# Patient Record
Sex: Male | Born: 1985 | Race: White | Hispanic: No | Marital: Married | Smoking: Current every day smoker
Health system: Southern US, Community
[De-identification: ages and names within clinical notes are randomized; demographics above are authoritative.]

## PROBLEM LIST (undated history)

## (undated) DIAGNOSIS — IMO0002 Reserved for concepts with insufficient information to code with codable children: Secondary | ICD-10-CM

## (undated) DIAGNOSIS — M51369 Other intervertebral disc degeneration, lumbar region without mention of lumbar back pain or lower extremity pain: Secondary | ICD-10-CM

## (undated) DIAGNOSIS — M5136 Other intervertebral disc degeneration, lumbar region: Secondary | ICD-10-CM

## (undated) HISTORY — PX: OTHER SURGICAL HISTORY: SHX169

## (undated) HISTORY — PX: APPENDECTOMY: SHX54

---

## 2009-12-30 ENCOUNTER — Emergency Department (HOSPITAL_COMMUNITY): Admission: EM | Admit: 2009-12-30 | Discharge: 2009-12-31 | Payer: Self-pay | Admitting: Emergency Medicine

## 2010-01-05 ENCOUNTER — Emergency Department (HOSPITAL_COMMUNITY): Admission: EM | Admit: 2010-01-05 | Discharge: 2010-01-05 | Payer: Self-pay | Admitting: Emergency Medicine

## 2010-01-08 ENCOUNTER — Emergency Department (HOSPITAL_COMMUNITY): Admission: EM | Admit: 2010-01-08 | Discharge: 2010-01-08 | Payer: Self-pay | Admitting: Emergency Medicine

## 2010-03-23 ENCOUNTER — Emergency Department (HOSPITAL_COMMUNITY): Admission: EM | Admit: 2010-03-23 | Discharge: 2010-03-23 | Payer: Self-pay | Admitting: Emergency Medicine

## 2010-03-27 ENCOUNTER — Emergency Department (HOSPITAL_COMMUNITY): Admission: EM | Admit: 2010-03-27 | Discharge: 2010-03-27 | Payer: Self-pay | Admitting: Emergency Medicine

## 2010-04-13 ENCOUNTER — Emergency Department (HOSPITAL_COMMUNITY): Admission: EM | Admit: 2010-04-13 | Discharge: 2010-04-13 | Payer: Self-pay | Admitting: Emergency Medicine

## 2010-04-16 ENCOUNTER — Emergency Department (HOSPITAL_COMMUNITY): Admission: EM | Admit: 2010-04-16 | Discharge: 2010-04-16 | Payer: Self-pay | Admitting: Emergency Medicine

## 2010-05-12 ENCOUNTER — Emergency Department (HOSPITAL_COMMUNITY)
Admission: EM | Admit: 2010-05-12 | Discharge: 2010-05-12 | Payer: Self-pay | Source: Home / Self Care | Admitting: Emergency Medicine

## 2010-05-29 ENCOUNTER — Emergency Department (HOSPITAL_COMMUNITY)
Admission: EM | Admit: 2010-05-29 | Discharge: 2010-05-29 | Payer: Self-pay | Source: Home / Self Care | Admitting: Emergency Medicine

## 2010-08-08 ENCOUNTER — Emergency Department (HOSPITAL_COMMUNITY)
Admission: EM | Admit: 2010-08-08 | Discharge: 2010-08-08 | Disposition: A | Discharge status: Home / Self Care | Payer: Self-pay | Attending: Emergency Medicine | Admitting: Emergency Medicine

## 2010-08-08 DIAGNOSIS — S335XXA Sprain of ligaments of lumbar spine, initial encounter: Secondary | ICD-10-CM | POA: Insufficient documentation

## 2010-08-08 DIAGNOSIS — M546 Pain in thoracic spine: Secondary | ICD-10-CM | POA: Insufficient documentation

## 2010-08-08 DIAGNOSIS — G8929 Other chronic pain: Secondary | ICD-10-CM | POA: Insufficient documentation

## 2010-08-08 DIAGNOSIS — M545 Low back pain, unspecified: Secondary | ICD-10-CM | POA: Insufficient documentation

## 2010-08-08 DIAGNOSIS — IMO0002 Reserved for concepts with insufficient information to code with codable children: Secondary | ICD-10-CM | POA: Insufficient documentation

## 2010-08-08 DIAGNOSIS — W108XXA Fall (on) (from) other stairs and steps, initial encounter: Secondary | ICD-10-CM | POA: Insufficient documentation

## 2010-08-14 ENCOUNTER — Emergency Department (HOSPITAL_COMMUNITY): Payer: Self-pay

## 2010-08-14 ENCOUNTER — Emergency Department (HOSPITAL_COMMUNITY)
Admission: EM | Admit: 2010-08-14 | Discharge: 2010-08-14 | Disposition: A | Discharge status: Home / Self Care | Payer: Self-pay | Attending: Emergency Medicine | Admitting: Emergency Medicine

## 2010-08-14 DIAGNOSIS — W19XXXA Unspecified fall, initial encounter: Secondary | ICD-10-CM | POA: Insufficient documentation

## 2010-08-14 DIAGNOSIS — M549 Dorsalgia, unspecified: Secondary | ICD-10-CM | POA: Insufficient documentation

## 2010-08-14 DIAGNOSIS — F17200 Nicotine dependence, unspecified, uncomplicated: Secondary | ICD-10-CM | POA: Insufficient documentation

## 2010-08-19 ENCOUNTER — Inpatient Hospital Stay (INDEPENDENT_AMBULATORY_CARE_PROVIDER_SITE_OTHER)
Admission: RE | Admit: 2010-08-19 | Discharge: 2010-08-19 | Disposition: A | Discharge status: Home / Self Care | Payer: Self-pay | Source: Ambulatory Visit | Attending: Family Medicine | Admitting: Family Medicine

## 2010-08-19 DIAGNOSIS — S335XXA Sprain of ligaments of lumbar spine, initial encounter: Secondary | ICD-10-CM

## 2010-08-27 ENCOUNTER — Emergency Department (HOSPITAL_COMMUNITY)
Admission: EM | Admit: 2010-08-27 | Discharge: 2010-08-27 | Disposition: A | Discharge status: Home / Self Care | Payer: Self-pay | Attending: Emergency Medicine | Admitting: Emergency Medicine

## 2010-08-27 ENCOUNTER — Emergency Department (HOSPITAL_COMMUNITY): Payer: Self-pay

## 2010-08-27 DIAGNOSIS — M545 Low back pain, unspecified: Secondary | ICD-10-CM | POA: Insufficient documentation

## 2010-08-27 DIAGNOSIS — W108XXA Fall (on) (from) other stairs and steps, initial encounter: Secondary | ICD-10-CM | POA: Insufficient documentation

## 2010-08-27 DIAGNOSIS — IMO0002 Reserved for concepts with insufficient information to code with codable children: Secondary | ICD-10-CM | POA: Insufficient documentation

## 2010-08-27 DIAGNOSIS — G8929 Other chronic pain: Secondary | ICD-10-CM | POA: Insufficient documentation

## 2010-11-07 ENCOUNTER — Emergency Department: Payer: Self-pay | Admitting: Emergency Medicine

## 2010-12-16 ENCOUNTER — Emergency Department: Payer: Self-pay | Admitting: Emergency Medicine

## 2013-05-18 ENCOUNTER — Encounter (HOSPITAL_COMMUNITY): Payer: Self-pay | Admitting: Emergency Medicine

## 2013-05-18 ENCOUNTER — Emergency Department (HOSPITAL_COMMUNITY)
Admission: EM | Admit: 2013-05-18 | Discharge: 2013-05-18 | Disposition: A | Discharge status: Home / Self Care | Payer: Self-pay | Attending: Emergency Medicine | Admitting: Emergency Medicine

## 2013-05-18 ENCOUNTER — Emergency Department (HOSPITAL_COMMUNITY): Payer: Self-pay

## 2013-05-18 DIAGNOSIS — M549 Dorsalgia, unspecified: Secondary | ICD-10-CM | POA: Insufficient documentation

## 2013-05-18 DIAGNOSIS — IMO0002 Reserved for concepts with insufficient information to code with codable children: Secondary | ICD-10-CM | POA: Insufficient documentation

## 2013-05-18 DIAGNOSIS — Y9389 Activity, other specified: Secondary | ICD-10-CM | POA: Insufficient documentation

## 2013-05-18 DIAGNOSIS — X500XXA Overexertion from strenuous movement or load, initial encounter: Secondary | ICD-10-CM | POA: Insufficient documentation

## 2013-05-18 DIAGNOSIS — Y929 Unspecified place or not applicable: Secondary | ICD-10-CM | POA: Insufficient documentation

## 2013-05-18 HISTORY — DX: Reserved for concepts with insufficient information to code with codable children: IMO0002

## 2013-05-18 MED ORDER — ORPHENADRINE CITRATE ER 100 MG PO TB12: 100.0000 mg | ORAL_TABLET | Freq: Two times a day (BID) | ORAL | Status: DC | PRN

## 2013-05-18 MED ORDER — SODIUM CHLORIDE 0.9 % IV BOLUS (SEPSIS)
500.0000 mL | Freq: Once | INTRAVENOUS | Status: AC
  Administered 2013-05-18: 500 mL via INTRAVENOUS

## 2013-05-18 MED ORDER — DIAZEPAM 5 MG/ML IJ SOLN
5.0000 mg | Freq: Once | INTRAMUSCULAR | Status: AC
  Administered 2013-05-18: 5 mg via INTRAVENOUS
  Filled 2013-05-18: qty 2

## 2013-05-18 MED ORDER — OXYCODONE-ACETAMINOPHEN 5-325 MG PO TABS: 1.0000 | ORAL_TABLET | ORAL | Status: DC | PRN

## 2013-05-18 MED ORDER — KETOROLAC TROMETHAMINE 30 MG/ML IJ SOLN
30.0000 mg | Freq: Once | INTRAMUSCULAR | Status: AC
  Administered 2013-05-18: 30 mg via INTRAVENOUS
  Filled 2013-05-18: qty 1

## 2013-05-18 MED ORDER — KETOROLAC TROMETHAMINE 60 MG/2ML IM SOLN
60.0000 mg | Freq: Once | INTRAMUSCULAR | Status: DC
  Filled 2013-05-18: qty 2

## 2013-05-18 NOTE — ED Notes (Signed)
Pt presents via [facility] with c/o back back. Pt was lifting a palette into his truck today when he felt a "pop" in his mid/low back. Pt reports immediately after the pop he had extreme pain shoot from his back throughout his body. Pt in LSB and C-collar on arrival. 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

## 2013-05-18 NOTE — ED Notes (Signed)
Pt ambulatory from tx room without difficulty. Pt comfortable with d/c and f/u instructions. Prescriptions x2. In NAD.

## 2013-05-18 NOTE — ED Provider Notes (Signed)
CSN: [id number]     Arrival date & time 05/18/13  1648 History   First MD Initiated Contact with Patient 05/18/13 1654     Chief Complaint  Patient presents with  . Back Pain   (Consider location/radiation/quality/duration/timing/severity/associated sxs/prior Treatment) HPI Comments: 27 year old male past medical history significant for previous disc problem in L. spine comes in with back pain. Patient states he was lifting a pallet out of the back of his truck approximately 30 mins prior arrival. When bending over and lifting he felt a pop in his mid thoracic or upper lumbar area. He had immediate sharp stabbing pain which is 10 out of 10. It radiated throughout his entire spine. Any numbness or tingling. Had no loss of motor or sensation. Pain now slightly improved however still 8/10. Worse with palpation movement.  Patient is a 27 y.o. male presenting with back pain.  Back Pain Location:  Thoracic spine Quality:  Stabbing Radiates to: initially radaiting throughout spine now no radiation. Pain severity:  Severe Pain is:  Unable to specify Onset quality:  Sudden Duration:  1 hour Timing:  Constant Progression:  Partially resolved Chronicity:  Recurrent Context: lifting heavy objects   Context comment:  Lifting Relieved by:  Nothing Worsened by:  Bending and movement Ineffective treatments:  None tried Associated symptoms: no abdominal pain, no chest pain, no dysuria, no headaches, no numbness and no weakness     Past Medical History  Diagnosis Date  . Bulging disc    History reviewed. No pertinent past surgical history. No family history on file. History  Substance Use Topics  . Smoking status: Not on file  . Smokeless tobacco: Not on file  . Alcohol Use: Not on file    Review of Systems  Constitutional: Negative for fatigue.  Respiratory: Negative for cough.   Cardiovascular: Negative for chest pain.  Gastrointestinal: Negative for abdominal pain.  Genitourinary:  Negative for dysuria.  Musculoskeletal: Positive for back pain.  Neurological: Negative for dizziness, weakness, numbness and headaches.  All other systems reviewed and are negative.    Allergies  Review of patient's allergies indicates no known allergies.  Home Medications   Current Outpatient Rx  Name  Route  Sig  Dispense  Refill  . orphenadrine (NORFLEX) 100 MG tablet   Oral   Take 1 tablet (100 mg total) by mouth 2 (two) times daily as needed for muscle spasms.   20 tablet   0   . oxyCODONE-acetaminophen (PERCOCET/ROXICET) 5-325 MG per tablet   Oral   Take 1-2 tablets by mouth every 4 (four) hours as needed for severe pain.   20 tablet   0    BP 115/73  Pulse 67  Temp(Src) 98.3 F (36.8 C) (Oral)  Resp 16  SpO2 99% Physical Exam  Nursing note and vitals reviewed. Constitutional: He is oriented to person, place, and time. He appears well-developed and well-nourished.  HENT:  Head: Normocephalic and atraumatic.  Eyes: EOM are normal. Pupils are equal, round, and reactive to light.  Neck:  Placed in collar by EMS  Cardiovascular: Normal rate, regular rhythm and intact distal pulses.   No murmur heard. Pulmonary/Chest: Effort normal and breath sounds normal. No respiratory distress. He exhibits no tenderness.  Abdominal: Soft. He exhibits no distension. There is no tenderness. There is no rebound and no guarding.  Musculoskeletal: Normal range of motion. He exhibits tenderness (spine).  Patient with: Tenderness to palpation over the lower thoracic and upper lumbar spine. Pain is located  directly over the spine minimal tenderness palpation of the paraspinous region.    Neurological: He is alert and oriented to person, place, and time. No cranial nerve deficit. He exhibits normal muscle tone. Coordination normal.  No focal neuro deficits. Patient denies any numbness, tingling throughout. Motor intact throughout. Sensation intact to fine, pinprick and temperature  throughout. Denies saddle anesthesia urinary incontinence.  Skin: Skin is warm and dry. No rash noted.  Psychiatric: He has a normal mood and affect. His behavior is normal. Judgment and thought content normal.    ED Course  Procedures (including critical care time) Labs Review Labs Reviewed - No data to display Imaging Review Dg Thoracic Spine 2 View  05/18/2013   CLINICAL DATA:  The patient was lifting a Pallet and felt his back popped. Pain in the mid and lower back. History of lower thoracic/ upper lumbar spine injury.  EXAM: THORACIC SPINE - 2 VIEW  COMPARISON:  None.  FINDINGS: There is no evidence of thoracic spine fracture. Alignment is normal. No other significant bone abnormalities are identified.  IMPRESSION: Negative.   Electronically Signed   By: Rosalie Gums M.D.   On: 05/18/2013 18:42   Dg Lumbar Spine Complete  05/18/2013   CLINICAL DATA:  Pain Raziyah Vanvleck trauma  EXAM: LUMBAR SPINE - COMPLETE 4+ VIEW  COMPARISON:  August 27, 2010  FINDINGS: Frontal, lateral, spot lumbosacral lateral, and bilateral oblique views were obtained. There are 5 non-rib-bearing lumbar type vertebral bodies. There is no fracture or spondylolisthesis. Disk spaces appear intact. There is no appreciable facet arthropathy.  IMPRESSION: No fracture or appreciable arthropathic change.   Electronically Signed   By: Bretta Bang M.D.   On: 05/18/2013 18:30    EKG Interpretation   None       MDM   1. Back pain     27 year old male back pain after lifting heavy pallet. On exam patient is neurovascularly intact. Patient denies any numbness or tingling. Denies any bladder or bowel dysfunction. No saddle anesthesia. On exam there is no step-offs deformities or other concerning findings. X-rays are obtained within normal limits. Reexam patient continues to have normal neurological exam without any deficits. The pain has improved with muscle relaxant and pain medicine. Patient able to ambulate tolerate by mouth.  Will provided work note to minimize lifting for one week. Will be given muscle relaxers and small narcotic prescription. At this time doubt any serious spinal pathology. Patient was given return precautions for concerning issues such as numbness, tingling, motor dysfunction etc. Voiced understanding and patient was discharged without further issues.   Bridgett Larsson, MD 05/18/13 [phone]

## 2013-05-19 NOTE — ED Provider Notes (Signed)
I saw and evaluated the patient, reviewed the resident's note and I agree with the findings and plan.  EKG Interpretation   None       Patient with back pain after lifting. Most tender on paraspinal muscles. Likely spasm/strain. Will treat symptomatically. No neurologic dysfunction or B/B incontinence. Normal gait.  Audree Camel, MD 05/19/13 1314

## 2013-10-02 ENCOUNTER — Emergency Department (HOSPITAL_COMMUNITY)
Admission: EM | Admit: 2013-10-02 | Discharge: 2013-10-02 | Disposition: A | Discharge status: Home / Self Care | Payer: No Typology Code available for payment source | Attending: Emergency Medicine | Admitting: Emergency Medicine

## 2013-10-02 ENCOUNTER — Encounter (HOSPITAL_COMMUNITY): Payer: Self-pay | Admitting: Emergency Medicine

## 2013-10-02 DIAGNOSIS — Z79899 Other long term (current) drug therapy: Secondary | ICD-10-CM | POA: Insufficient documentation

## 2013-10-02 DIAGNOSIS — IMO0002 Reserved for concepts with insufficient information to code with codable children: Secondary | ICD-10-CM | POA: Insufficient documentation

## 2013-10-02 DIAGNOSIS — K529 Noninfective gastroenteritis and colitis, unspecified: Secondary | ICD-10-CM

## 2013-10-02 DIAGNOSIS — B349 Viral infection, unspecified: Secondary | ICD-10-CM

## 2013-10-02 DIAGNOSIS — A088 Other specified intestinal infections: Secondary | ICD-10-CM | POA: Insufficient documentation

## 2013-10-02 LAB — URINALYSIS, ROUTINE W REFLEX MICROSCOPIC
Bilirubin Urine: NEGATIVE
Glucose, UA: NEGATIVE mg/dL
KETONES UR: NEGATIVE mg/dL
Leukocytes, UA: NEGATIVE
NITRITE: NEGATIVE
PH: 6.5 (ref 5.0–8.0)
Protein, ur: 30 mg/dL — AB
SPECIFIC GRAVITY, URINE: 1.021 (ref 1.005–1.030)
Urobilinogen, UA: 0.2 mg/dL (ref 0.0–1.0)

## 2013-10-02 LAB — CBC WITH DIFFERENTIAL/PLATELET
BASOS PCT: 0 % (ref 0–1)
Basophils Absolute: 0 10*3/uL (ref 0.0–0.1)
EOS ABS: 0.2 10*3/uL (ref 0.0–0.7)
EOS PCT: 2 % (ref 0–5)
HCT: 45.4 % (ref 39.0–52.0)
HEMOGLOBIN: 15 g/dL (ref 13.0–17.0)
LYMPHS ABS: 2 10*3/uL (ref 0.7–4.0)
Lymphocytes Relative: 14 % (ref 12–46)
MCH: 30 pg (ref 26.0–34.0)
MCHC: 33 g/dL (ref 30.0–36.0)
MCV: 90.8 fL (ref 78.0–100.0)
MONOS PCT: 4 % (ref 3–12)
Monocytes Absolute: 0.5 10*3/uL (ref 0.1–1.0)
Neutro Abs: 11.2 10*3/uL — ABNORMAL HIGH (ref 1.7–7.7)
Neutrophils Relative %: 80 % — ABNORMAL HIGH (ref 43–77)
PLATELETS: 288 10*3/uL (ref 150–400)
RBC: 5 MIL/uL (ref 4.22–5.81)
RDW: 13.6 % (ref 11.5–15.5)
WBC: 14 10*3/uL — ABNORMAL HIGH (ref 4.0–10.5)

## 2013-10-02 LAB — COMPREHENSIVE METABOLIC PANEL
ALBUMIN: 4.2 g/dL (ref 3.5–5.2)
ALT: 25 U/L (ref 0–53)
AST: 30 U/L (ref 0–37)
Alkaline Phosphatase: 115 U/L (ref 39–117)
BUN: 12 mg/dL (ref 6–23)
CALCIUM: 9.7 mg/dL (ref 8.4–10.5)
CO2: 24 mEq/L (ref 19–32)
CREATININE: 0.73 mg/dL (ref 0.50–1.35)
Chloride: 104 mEq/L (ref 96–112)
GFR calc non Af Amer: >90 mL/min (ref >90)
GLUCOSE: 116 mg/dL — AB (ref 70–99)
Potassium: 4.3 mEq/L (ref 3.7–5.3)
Sodium: 141 mEq/L (ref 137–147)
TOTAL PROTEIN: 7.6 g/dL (ref 6.0–8.3)
Total Bilirubin: 0.3 mg/dL (ref 0.3–1.2)

## 2013-10-02 LAB — URINE MICROSCOPIC-ADD ON

## 2013-10-02 LAB — LIPASE, BLOOD: Lipase: 30 U/L (ref 11–59)

## 2013-10-02 MED ORDER — ONDANSETRON HCL 4 MG PO TABS: 4.0000 mg | ORAL_TABLET | Freq: Four times a day (QID) | ORAL | Status: DC

## 2013-10-02 MED ORDER — KETOROLAC TROMETHAMINE 30 MG/ML IJ SOLN: 30.0000 mg | Freq: Once | INTRAMUSCULAR | Status: DC

## 2013-10-02 MED ORDER — SODIUM CHLORIDE 0.9 % IV BOLUS (SEPSIS)
1000.0000 mL | Freq: Once | INTRAVENOUS | Status: AC
  Administered 2013-10-02: 1000 mL via INTRAVENOUS

## 2013-10-02 MED ORDER — KETOROLAC TROMETHAMINE 15 MG/ML IJ SOLN
15.0000 mg | Freq: Once | INTRAMUSCULAR | Status: AC
  Administered 2013-10-02: 15 mg via INTRAVENOUS
  Filled 2013-10-02: qty 1

## 2013-10-02 MED ORDER — ONDANSETRON HCL 4 MG/2ML IJ SOLN
4.0000 mg | Freq: Once | INTRAMUSCULAR | Status: AC
  Administered 2013-10-02: 4 mg via INTRAVENOUS
  Filled 2013-10-02: qty 2

## 2013-10-02 MED ORDER — KETOROLAC TROMETHAMINE 30 MG/ML IJ SOLN: 15.0000 mg | Freq: Once | INTRAMUSCULAR | Status: DC

## 2013-10-02 NOTE — ED Notes (Signed)
PT tolerating PO fluids without issue

## 2013-10-02 NOTE — ED Provider Notes (Signed)
CSN: [id number]     Arrival date & time 10/02/13  1147 History   First MD Initiated Contact with Patient 10/02/13 1157     Chief Complaint  Patient presents with  . Emesis     (Consider location/radiation/quality/duration/timing/severity/associated sxs/prior Treatment) The history is provided by the patient. No language interpreter was used.  Dave Wolf is a  28 year old male with past medical history bulging discs presenting to the ED with emesis and nausea that started this morning at approximately 6:00 AM. Patient reported that he has been having ongoing nausea here he stated he had at least 3 episodes of emesis-mainly of food-NB/NB. Reported that he's been having generalized bodyaches more so in his legs. Stated he's been experiencing a gurgling sensation in his abdomen and a soreness after emesis. Patient reported he felt just fine yesterday-reportedly went pain pulling followed by [employer] for dinner. Stated that he's been trying to keep down water, reported that he is unable to keep any food or fluids down. Denied neck pain, neck stiffness, diarrhea, dizziness, back pain, chest pain, shortness of breath, difficulty breathing, urinary symptoms, melena, medications your, constipation, abdominal pain, sudden loss of vision, syncope, visual distortions, headaches. PCP none  Past Medical History  Diagnosis Date  . Bulging disc    History reviewed. No pertinent past surgical history. History reviewed. No pertinent family history. History  Substance Use Topics  . Smoking status: Not on file  . Smokeless tobacco: Not on file  . Alcohol Use: Not on file    Review of Systems  Constitutional: Negative for fever, chills and diaphoresis.  Eyes: Negative for visual disturbance.  Respiratory: Negative for chest tightness and shortness of breath.   Cardiovascular: Negative for chest pain.  Gastrointestinal: Positive for nausea and vomiting. Negative for abdominal pain, diarrhea, constipation,  blood in stool and anal bleeding.  Musculoskeletal: Negative for back pain, neck pain and neck stiffness.  Neurological: Negative for dizziness, weakness and headaches.  All other systems reviewed and are negative.     Allergies  Review of patient's allergies indicates no known allergies.  Home Medications   Prior to Admission medications   Medication Sig Start Date End Date Taking? Authorizing Provider  orphenadrine (NORFLEX) 100 MG tablet Take 1 tablet (100 mg total) by mouth 2 (two) times daily as needed for muscle spasms. 05/18/13   Bridgett Larsson, MD  oxyCODONE-acetaminophen (PERCOCET/ROXICET) 5-325 MG per tablet Take 1-2 tablets by mouth every 4 (four) hours as needed for severe pain. 05/18/13   Bridgett Larsson, MD   BP 139/86  Pulse 82  Temp(Src) 98.4 F (36.9 C) (Oral)  Resp 16  Ht 6\' 3"  (1.905 m)  Wt 192 lb 12.8 oz (87.454 kg)  BMI 24.10 kg/m2  SpO2 100% Physical Exam  Nursing note and vitals reviewed. Constitutional: He is oriented to person, place, and time. He appears well-developed and well-nourished. No distress.  HENT:  Head: Normocephalic and atraumatic.  Mouth/Throat: No oropharyngeal exudate.  Mild dry mucous membranes  Eyes: Conjunctivae and EOM are normal. Pupils are equal, round, and reactive to light. Right eye exhibits no discharge. Left eye exhibits no discharge.  Neck: Normal range of motion. Neck supple. No tracheal deviation present.  Negative neck stiffness Negative nuchal rigidity Negative cervical lymphadenopathy Negative meningeal signs  Cardiovascular: Normal rate, regular rhythm and normal heart sounds.  Exam reveals no friction rub.   No murmur heard. Pulses:      Radial pulses are 2+ on the right side, and 2+ on  the left side.       Dorsalis pedis pulses are 2+ on the right side, and 2+ on the left side.  Pulmonary/Chest: Effort normal and breath sounds normal. No respiratory distress. He has no wheezes. He has no rales.  Abdominal: Soft. Bowel  sounds are normal. There is no tenderness. There is no rebound and no guarding.  Negative abdominal distention Soft upon palpation Negative peritoneal signs Negative rigidity upon palpation  Musculoskeletal: Normal range of motion.  Full ROM to upper and lower extremities without difficulty noted, negative ataxia noted.  Lymphadenopathy:    He has no cervical adenopathy.  Neurological: He is alert and oriented to person, place, and time. No cranial nerve deficit. He exhibits normal muscle tone. Coordination normal.  Skin: Skin is warm and dry. No rash noted. He is not diaphoretic. No erythema.  Psychiatric: He has a normal mood and affect. His behavior is normal. Thought content normal.    ED Course  Procedures (including critical care time)  Results for orders placed during the hospital encounter of 10/02/13  CBC WITH DIFFERENTIAL      Result Value Ref Range   WBC 14.0 (*) 4.0 - 10.5 K/uL   RBC 5.00  4.22 - 5.81 MIL/uL   Hemoglobin 15.0  13.0 - 17.0 g/dL   HCT 16.145.4  09.639.0 - 04.552.0 %   MCV 90.8  78.0 - 100.0 fL   MCH 30.0  26.0 - 34.0 pg   MCHC 33.0  30.0 - 36.0 g/dL   RDW 40.913.6  81.111.5 - 91.415.5 %   Platelets 288  150 - 400 K/uL   Neutrophils Relative % 80 (*) 43 - 77 %   Neutro Abs 11.2 (*) 1.7 - 7.7 K/uL   Lymphocytes Relative 14  12 - 46 %   Lymphs Abs 2.0  0.7 - 4.0 K/uL   Monocytes Relative 4  3 - 12 %   Monocytes Absolute 0.5  0.1 - 1.0 K/uL   Eosinophils Relative 2  0 - 5 %   Eosinophils Absolute 0.2  0.0 - 0.7 K/uL   Basophils Relative 0  0 - 1 %   Basophils Absolute 0.0  0.0 - 0.1 K/uL  COMPREHENSIVE METABOLIC PANEL      Result Value Ref Range   Sodium 141  137 - 147 mEq/L   Potassium 4.3  3.7 - 5.3 mEq/L   Chloride 104  96 - 112 mEq/L   CO2 24  19 - 32 mEq/L   Glucose, Bld 116 (*) 70 - 99 mg/dL   BUN 12  6 - 23 mg/dL   Creatinine, Ser 7.820.73  0.50 - 1.35 mg/dL   Calcium 9.7  8.4 - 95.610.5 mg/dL   Total Protein 7.6  6.0 - 8.3 g/dL   Albumin 4.2  3.5 - 5.2 g/dL   AST 30   0 - 37 U/L   ALT 25  0 - 53 U/L   Alkaline Phosphatase 115  39 - 117 U/L   Total Bilirubin 0.3  0.3 - 1.2 mg/dL   GFR calc non Af Amer >90  >90 mL/min   GFR calc Af Amer >90  >90 mL/min  URINALYSIS, ROUTINE W REFLEX MICROSCOPIC      Result Value Ref Range   Color, Urine YELLOW  YELLOW   APPearance CLEAR  CLEAR   Specific Gravity, Urine 1.021  1.005 - 1.030   pH 6.5  5.0 - 8.0   Glucose, UA NEGATIVE  NEGATIVE mg/dL  Hgb urine dipstick MODERATE (*) NEGATIVE   Bilirubin Urine NEGATIVE  NEGATIVE   Ketones, ur NEGATIVE  NEGATIVE mg/dL   Protein, ur 30 (*) NEGATIVE mg/dL   Urobilinogen, UA 0.2  0.0 - 1.0 mg/dL   Nitrite NEGATIVE  NEGATIVE   Leukocytes, UA NEGATIVE  NEGATIVE  LIPASE, BLOOD      Result Value Ref Range   Lipase 30  11 - 59 U/L  URINE MICROSCOPIC-ADD ON      Result Value Ref Range   RBC / HPF 3-6  <3 RBC/hpf    Labs Review Labs Reviewed  CBC WITH DIFFERENTIAL - Abnormal; Notable for the following:    WBC 14.0 (*)    Neutrophils Relative % 80 (*)    Neutro Abs 11.2 (*)    All other components within normal limits  COMPREHENSIVE METABOLIC PANEL - Abnormal; Notable for the following:    Glucose, Bld 116 (*)    All other components within normal limits  URINALYSIS, ROUTINE W REFLEX MICROSCOPIC - Abnormal; Notable for the following:    Hgb urine dipstick MODERATE (*)    Protein, ur 30 (*)    All other components within normal limits  LIPASE, BLOOD  URINE MICROSCOPIC-ADD ON    Imaging Review No results found.   EKG Interpretation None      MDM   Final diagnoses:  Gastroenteritis  Viral syndrome   Medications  sodium chloride 0.9 % bolus 1,000 mL (0 mLs Intravenous Stopped 10/02/13 1426)  ondansetron (ZOFRAN) injection 4 mg (4 mg Intravenous Given 10/02/13 1318)  ketorolac (TORADOL) 15 MG/ML injection 15 mg (15 mg Intravenous Given 10/02/13 1344)  sodium chloride 0.9 % bolus 1,000 mL (1,000 mLs Intravenous New Bag/Given 10/02/13 1431)   Filed Vitals:    10/02/13 1530 10/02/13 1545 10/02/13 1625 10/02/13 1659  BP: 129/76 122/84 134/87 139/86  Pulse: 74 85 85 82  Temp:  98.4 F (36.9 C)    TempSrc:  Oral    Resp:  16 16 16   Height:      Weight:      SpO2: 99% 100% 99% 100%    CBC noted elevation Traub blood cell count of 14.0 with left shift of neutrophils at 11.2. CMP negative findings-kidney and liver functioning well. Lipase negative elevation. Urinalysis negative for nitrates, leukocytes. Moderate hemoglobin identified in the urine. Patient appears dry-will hydrate with IV fluids. 5:12 PM This provider reassessed the patient's abdomen. Bowel sounds normoactive in all 4 quadrants. Soft upon palpation. Negative peritoneal signs. Negative right upper quadrant tenderness. Negative tenderness upon palpation to the abdomen. Nonsurgical abdomen again noted. Doubt appendicitis-patient had appendectomy performed years ago. Doubt pancreatitis. Doubt cholecystitis/cholangitis. Doubt acute abdominal processes. Abdomen soft, nontender. Negative peritoneal signs-nonsurgical abdomen noted on exam. Suspicion to be possible viral infection, cannot rule out food poisoning. Patient hydrated in ED setting. Patient tolerated fluids by mouth without difficulty-negative episodes of emesis while in ED setting. Leukocytosis with unknown etiology-suspicion to be possible viral infection. Patient stable, afebrile. Patient is not septic appearing. Negative hypoxia. Negative signs of respiratory distress. Discharged patient. Discharge patient with Zofran. Discussed with patient to rest and stay hydrated to drink plenty of fluids. Discussed with patient proper diet. Referred patient to urgent care Center and health [facility] wellness Center to be reassessed next week for repeat labs and urine. Referred patient to urology regarding Hgb in urine. Discussed with patient to closely monitor symptoms and if symptoms are to worsen or change to report back to the ED -  strict return  instructions given.  Patient agreed to plan of care, understood, all questions answered.   Raymon MuttonMarissa Jeanene Mena, PA-C 10/03/13 1155

## 2013-10-02 NOTE — Discharge Instructions (Signed)
Please call and set-up an appointment with [facility] to be re-assessed within the next couple of days for bloodwork to be repeated Please call and set up an appointment with urology regarding blood in the urine Please rest and stay hydrated Please take nausea medications as prescribed and as needed Please avoid foods high in fat and grease - stick with a lite diet for the next couple of days Please continue to monitor symptoms closely and if symptoms are to worsen or change (fever greater than 101, chills, chest pain, shortness of breath, difficulty breathing, numbness, tingling, abdominal pain, blood in stools, black for stools, blood in the urine, difficulty urinating, urine decrease, inability to keep food or fluids down) please report back to the ED immediately  Viral Gastroenteritis Viral gastroenteritis is also known as stomach flu. This condition affects the stomach and intestinal tract. It can cause sudden diarrhea and vomiting. The illness typically lasts 3 to 8 days. Most people develop an immune response that eventually gets rid of the virus. While this natural response develops, the virus can make you quite ill. CAUSES  Many different viruses can cause gastroenteritis, such as rotavirus or noroviruses. You can catch one of these viruses by consuming contaminated food or water. You may also catch a virus by sharing utensils or other personal items with an infected person or by touching a contaminated surface. SYMPTOMS  The most common symptoms are diarrhea and vomiting. These problems can cause a severe loss of body fluids (dehydration) and a body salt (electrolyte) imbalance. Other symptoms may include:  Fever.  Headache.  Fatigue.  Abdominal pain. DIAGNOSIS  Your caregiver can usually diagnose viral gastroenteritis based on your symptoms and a physical exam. A stool sample may also be taken to test for the presence of viruses or other  infections. TREATMENT  This illness typically goes away on its own. Treatments are aimed at rehydration. The most serious cases of viral gastroenteritis involve vomiting so severely that you are not able to keep fluids down. In these cases, fluids must be given through an intravenous line (IV). HOME CARE INSTRUCTIONS   Drink enough fluids to keep your urine clear or pale yellow. Drink small amounts of fluids frequently and increase the amounts as tolerated.  Ask your caregiver for specific rehydration instructions.  Avoid:  Foods high in sugar.  Alcohol.  Carbonated drinks.  Tobacco.  Juice.  Caffeine drinks.  Extremely hot or cold fluids.  Fatty, greasy foods.  Too much intake of anything at one time.  Dairy products until 24 to 48 hours after diarrhea stops.  You may consume probiotics. Probiotics are active cultures of beneficial bacteria. They may lessen the amount and number of diarrheal stools in adults. Probiotics can be found in yogurt with active cultures and in supplements.  Wash your hands well to avoid spreading the virus.  Only take over-the-counter or prescription medicines for pain, discomfort, or fever as directed by your caregiver. Do not give aspirin to children. Antidiarrheal medicines are not recommended.  Ask your caregiver if you should continue to take your regular prescribed and over-the-counter medicines.  Keep all follow-up appointments as directed by your caregiver. SEEK IMMEDIATE MEDICAL CARE IF:   You are unable to keep fluids down.  You do not urinate at least once every 6 to 8 hours.  You develop shortness of breath.  You notice blood in your stool or vomit. This may look like coffee grounds.  You  have abdominal pain that increases or is concentrated in one small area (localized).  You have persistent vomiting or diarrhea.  You have a fever.  The patient is a child younger than 3 months, and he or she has a fever.  The patient  is a child older than 3 months, and he or she has a fever and persistent symptoms.  The patient is a child older than 3 months, and he or she has a fever and symptoms suddenly get worse.  The patient is a baby, and he or she has no tears when crying. MAKE SURE YOU:   Understand these instructions.  Will watch your condition.  Will get help right away if you are not doing well or get worse. Document Released: 05/26/2005 Document Revised: 08/18/2011 Document Reviewed: 03/12/2011 [facility] Patient Information 2014 [place], [place].   Emergency Department Resource Guide 1) Find a Doctor and Pay Out of Pocket Although you won't have to find out who is covered by your insurance plan, it is a good idea to ask around and get recommendations. You will then need to call the office and see if the doctor you have chosen will accept you as a new patient and what types of options they offer for patients who are self-pay. Some doctors offer discounts or will set up payment plans for their patients who do not have insurance, but you will need to ask so you aren't surprised when you get to your appointment.  2) Contact Your Local Health Department Not all health departments have doctors that can see patients for sick visits, but many do, so it is worth a call to see if yours does. If you don't know where your local health department is, you can check in your phone book. The CDC also has a tool to help you locate your state's health department, and many state websites also have listings of all of their local health departments.  3) Find a Walk-in Clinic If your illness is not likely to be very severe or complicated, you may want to try a walk in clinic. These are popping up all over the country in pharmacies, drugstores, and shopping centers. They're usually staffed by nurse practitioners or physician assistants that have been trained to treat common illnesses and complaints. They're usually fairly quick and  inexpensive. However, if you have serious medical issues or chronic medical problems, these are probably not your best option.  No Primary Care Doctor: - Call Health Connect at  [phone] - they can help you locate a primary care doctor that  accepts your insurance, provides certain services, etc. - Physician Referral Service- [phone]  Chronic Pain Problems: Organization         Address  Phone   Notes  [facility]  [phone] Patients need to be referred by their primary care doctor.   Medication Assistance: Organization         Address  Phone   Notes  [facility] Medication [facility] [address] [place], [place] [phone] --Must be a resident of [facility] [place] -- Must have NO insurance coverage whatsoever (no Medicaid/ Medicare, etc.) -- The pt. MUST have a primary care doctor that directs their care regularly and follows them in the community   MedAssist  [phone]   [employer]  [phone]) [phone]    Agencies that provide inexpensive medical care: Retail buyerrganization         Address  Phone  Notes  [place]  [phone]   [facility]    [phone]   [facility] [place], [place] [address] [phone]   [place] [address], [place] [phone]   Planned Parenthood    [phone]   [facility]    [phone]   [place] [address], [place] Phone:  [phone], Fax:  [phone] Hours of Operation:  9 am - 6 pm, M-F.  Also accepts Medicaid/Medicare and self-pay.  [facility] for [place], [address], [place] Phone: [phone], Fax: [phone]. Hours of Operation:  8:30 am - 5:30 pm, M-F.  Also accepts Medicaid and self-pay.  [facility] [place] [address], [place] Phone: [phone]   [place], [place], [place] [phone], Ext. [phone] Mondays & Thursdays: 7-9 AM.  First 15 patients are seen on a first come, first serve basis.    [place] Providers:  Organization         Address  Phone   Notes  [facility] [address], [place] [phone] Also accepts self-pay patients.  [facility] [address], [place]  [phone]   [place], [address], [place] [phone]   [facility] [address] [place] [phone]   Lucianne Lei [address], [place]   [phone] Only accepts [place] Access [place] patients after they have their name applied to their card.   Self-Pay (no insurance) in [facility]:  Organization         Address  Phone   Notes  Sickle Cell Patients, [facility] [place] [phone]   [facility] [place] [phone]   [facility] [place]  [place], [place], [place] [phone]   [facility]/Dr. Osei-Bonsu  [address] [place] or [address], [place] [phone] Phone number for both [place] and [place] locations is the same.  [facility] [address], [place] [phone]   [facility] [address], [place] or [address] [phone]   [facility] [address], [place] [phone], phone; [phone], fax Sees patients 1st and 3rd Saturday of every month.  Must not qualify for public or private insurance (i.e. Medicaid, Medicare, [place] Health Choice, Veterans' Benefits)  Household income should be no more than 200% of the poverty level The clinic cannot treat you if you are pregnant or  think you are pregnant  Sexually transmitted diseases are not treated at the clinic.    Dental Care: Organization         Address  Phone  Notes  [facility] Department of [facility] [place] [phone] Accepts children up to age 75 who are enrolled in [place] or [place]; pregnant women with a Medicaid card; and children who have applied for Medicaid or [place] Health Choice, but were declined, whose parents can pay a reduced fee at time of service.  [facility]  Department of [facility]  [address], [place] [phone] Accepts children up to age 20 who are enrolled in [place] or [place]; pregnant women with a Medicaid card; and children who have applied for Medicaid or  Health Choice, but were declined, whose parents can pay a reduced fee at time of service.  [place] Adult Dental Access PROGRAM  [place] [phone] Patients are seen by appointment only. Walk-ins are not accepted. [place] will see patients 73 years of age and older. Monday - Tuesday (8am-5pm) Most Wednesdays (8:30-5pm) $30 per visit, cash only  [facility] Adult Dental Access PROGRAM  [address], [facility] [phone] Patients are seen by appointment only. Walk-ins are not accepted. [place] will see patients 66 years of age and older. One Wednesday Evening (Monthly: Volunteer Based).  $30 per visit, cash only  [place]  [phone] for adults; Children under age 48, call Graduate Pediatric Dentistry at [phone]. Children aged 62-14, please call [phone] to request a pediatric application.  Dental services are provided in all areas of dental care including fillings, crowns and bridges, complete and partial dentures, implants, gum treatment, root canals, and extractions. Preventive care is also provided. Treatment is provided to both adults  and children. Patients are selected via a lottery and there is often a waiting list.   [facility] [address] [place]  [phone] [email or web address]   [facility] [address] [place], [place] [phone], Ext. [phone] Second and Fourth Thursday of each month, opens at 6:30 AM; Clinic ends at 9 AM.  Patients are seen on a first-come first-served basis, and a limited number are seen during each clinic.   [facility]  [address] Hillard Danker [place], [place] [phone]   Eligibility Requirements You must have lived in [place], [place], or [place] counties for at least the last three months.   You cannot be eligible for state or federal sponsored [employer], including [employer], [place], or [employer].   You generally cannot be eligible for healthcare insurance through your employer.    How to apply: Eligibility screenings are held every Tuesday and Wednesday afternoon from 1:00 pm until 4:00 pm. You do not need an appointment for the interview!  [facility] [address] [place], [place]   [place]  [place] Department  [place]  [phone]    Behavioral Health Resources in the Community: Intensive Outpatient Programs Organization         Address  Phone  Notes  Mason Macon. [address], [place], [place] [phone]   [facility] [address], [place], [place]   ADS: Alcohol & Drug Svcs [address], [place], [place]   [place] [address]  [place], [place] or [phone]   Substance Abuse Resources Organization         Address  Phone  Notes  Alcohol and Drug Services  [phone]   [place]  [phone]   The [place]     Chinita Pester  [phone]   Residential & Outpatient Substance Abuse Program  [phone]   Psychological Services Organization         Address  Phone  Notes  [facility]  [phone]- [phone]   [facility]  [phone]- [phone]   [facility] [address], [place] [phone] or [phone]    Mobile Crisis Teams Organization         Address  Phone  Notes  [facility], Mobile Crisis Care Unit  [phone]   [facility]  [address] [place], [place] [phone]   Doristine LocksSharon DeEsch [address] [place] [phone]    Self-Help/Support Groups Organization         Address  Phone             Notes  [facility] [place] - variety of support groups  336- [phone] Call for more information  Narcotics Anonymous (NA), [facility] [address], [place]  2 meetings at this location   Statisticianesidential Treatment Programs Organization         Address  Phone  Notes  [facility] [address],    [place]  [phone]   [facility]  [address], [place] [phone], [place], [place] [phone]   [facility] [address], [place] [phone] Admissions: 8am-3pm M-F  [facility] [address]    [place], [place] [phone]   [facility] [address], [place], [place] [phone]   [facility] [address]  [place], [place] [phone]   [facility] - Intensive Outpatient [address], [place], [place] [phone]   [facility] (Addiction Recovery [facility] [address]  [place], [place] [phone] or [phone]   [facility] (RTS) [address] [place], [place] [phone] Accepts Medicaid  [facility] [address]  [place] [phone] Substance Abuse/Addiction Treatment   [facility]  Behavioral Health Resources Organization         Address  Phone  Notes  [facility]  [phone]) [phone]   Angie FavaJulie Brannon, PhD [address], [address] [place], [place]   [phone] or [phone]   [facility] [place]   [address] [place], [place] [phone]   [facility] [address], [place], [place] [phone]) [phone] Insurance/Medicaid/sponsorship through [facility]  [facility] Families [address]                                    [place], [place] [phone]) [phone] Therapy/tele-psych/case  [facility] [address]   [place], [place] [phone](336) [phone]    Dr. Lolly MustacheArfeen  [phone]   [facility]  [employer] [facility] 1) [address], [place] 2) [address], [place] 3)  [address], [place] [phone]  [phone](336) [phone]   [facility] Child Abuse Hotline [phone](336) [phone] or [phone](336) [phone] (After Hours)

## 2013-10-02 NOTE — ED Notes (Signed)
He states he started to vomit this morning and hes having body aches and headaches

## 2013-10-05 NOTE — ED Provider Notes (Signed)
  Medical screening examination/treatment/procedure(s) were performed by non-physician practitioner and as supervising physician I was immediately available for consultation/collaboration.   EKG Interpretation None           Gerhard Munchobert Jusitn Salsgiver, MD 10/05/13 1517

## 2013-12-12 ENCOUNTER — Encounter (HOSPITAL_COMMUNITY): Payer: Self-pay | Admitting: Emergency Medicine

## 2013-12-12 ENCOUNTER — Emergency Department (HOSPITAL_COMMUNITY): Payer: No Typology Code available for payment source

## 2013-12-12 DIAGNOSIS — I951 Orthostatic hypotension: Secondary | ICD-10-CM | POA: Insufficient documentation

## 2013-12-12 DIAGNOSIS — Z8739 Personal history of other diseases of the musculoskeletal system and connective tissue: Secondary | ICD-10-CM | POA: Insufficient documentation

## 2013-12-12 DIAGNOSIS — X58XXXA Exposure to other specified factors, initial encounter: Secondary | ICD-10-CM | POA: Insufficient documentation

## 2013-12-12 DIAGNOSIS — S0990XA Unspecified injury of head, initial encounter: Secondary | ICD-10-CM | POA: Insufficient documentation

## 2013-12-12 DIAGNOSIS — Y92009 Unspecified place in unspecified non-institutional (private) residence as the place of occurrence of the external cause: Secondary | ICD-10-CM | POA: Insufficient documentation

## 2013-12-12 DIAGNOSIS — R55 Syncope and collapse: Secondary | ICD-10-CM | POA: Insufficient documentation

## 2013-12-12 DIAGNOSIS — Y9389 Activity, other specified: Secondary | ICD-10-CM | POA: Insufficient documentation

## 2013-12-12 LAB — CBC
HEMATOCRIT: 39.9 % (ref 39.0–52.0)
Hemoglobin: 13 g/dL (ref 13.0–17.0)
MCH: 29.7 pg (ref 26.0–34.0)
MCHC: 32.6 g/dL (ref 30.0–36.0)
MCV: 91.1 fL (ref 78.0–100.0)
Platelets: 276 10*3/uL (ref 150–400)
RBC: 4.38 MIL/uL (ref 4.22–5.81)
RDW: 13.9 % (ref 11.5–15.5)
WBC: 9.8 10*3/uL (ref 4.0–10.5)

## 2013-12-12 LAB — BASIC METABOLIC PANEL
Anion gap: 15 (ref 5–15)
BUN: 11 mg/dL (ref 6–23)
CHLORIDE: 101 meq/L (ref 96–112)
CO2: 26 mEq/L (ref 19–32)
CREATININE: 0.79 mg/dL (ref 0.50–1.35)
Calcium: 9.4 mg/dL (ref 8.4–10.5)
GFR calc Af Amer: >90 mL/min (ref >90)
GFR calc non Af Amer: >90 mL/min (ref >90)
GLUCOSE: 117 mg/dL — AB (ref 70–99)
Potassium: 3.5 mEq/L — ABNORMAL LOW (ref 3.7–5.3)
Sodium: 142 mEq/L (ref 137–147)

## 2013-12-12 MED ORDER — ONDANSETRON 4 MG PO TBDP
8.0000 mg | ORAL_TABLET | Freq: Once | ORAL | Status: AC
  Administered 2013-12-12: 8 mg via ORAL
  Filled 2013-12-12: qty 2

## 2013-12-12 MED ORDER — FENTANYL CITRATE 0.05 MG/ML IJ SOLN
50.0000 ug | Freq: Once | INTRAMUSCULAR | Status: AC
  Administered 2013-12-12: 50 ug via NASAL
  Filled 2013-12-12: qty 2

## 2013-12-12 MED ORDER — OXYCODONE-ACETAMINOPHEN 5-325 MG PO TABS
1.0000 | ORAL_TABLET | Freq: Once | ORAL | Status: DC
  Filled 2013-12-12: qty 1

## 2013-12-12 NOTE — ED Notes (Addendum)
Presents with nausea, vomiting and headache. PT is unsure of what happened. He states, "I was at home with my kids and I stood up and i don't know if I got light headed, but best I can figure is I stood up and then went out and hit my head on the coffee table. I got lightheaded and dizzy earlier today but did not pass out. My whole head hurts. I am nauseated but can't get anything up" accident occurred at 9 pm this evening. Bilateral pupils brisk, 4, equal. Answering questions appropriately.  Hematoma to top of scalp. Pain  described as throbbing.  Denies neck pain.

## 2013-12-13 ENCOUNTER — Emergency Department (HOSPITAL_COMMUNITY)
Admission: EM | Admit: 2013-12-13 | Discharge: 2013-12-13 | Disposition: A | Discharge status: Home / Self Care | Payer: No Typology Code available for payment source | Attending: Emergency Medicine | Admitting: Emergency Medicine

## 2013-12-13 DIAGNOSIS — I951 Orthostatic hypotension: Secondary | ICD-10-CM

## 2013-12-13 LAB — D-DIMER, QUANTITATIVE: D-Dimer, Quant: <0.27 ug/mL-FEU (ref 0.00–0.48)

## 2013-12-13 MED ORDER — SODIUM CHLORIDE 0.9 % IV BOLUS (SEPSIS)
1000.0000 mL | Freq: Once | INTRAVENOUS | Status: AC
  Administered 2013-12-13: 1000 mL via INTRAVENOUS

## 2013-12-13 MED ORDER — KETOROLAC TROMETHAMINE 30 MG/ML IJ SOLN
30.0000 mg | Freq: Once | INTRAMUSCULAR | Status: AC
  Administered 2013-12-13: 30 mg via INTRAVENOUS
  Filled 2013-12-13: qty 1

## 2013-12-13 MED ORDER — POTASSIUM CHLORIDE CRYS ER 20 MEQ PO TBCR
40.0000 meq | EXTENDED_RELEASE_TABLET | Freq: Once | ORAL | Status: AC
  Administered 2013-12-13: 40 meq via ORAL
  Filled 2013-12-13: qty 2

## 2013-12-13 NOTE — ED Notes (Signed)
Peter PA at bedside

## 2013-12-13 NOTE — Discharge Instructions (Signed)
Your laboratory testing today did not show any signs for an emergent cause of your loss of consciousness. Your providers feel this may be from a drop in your blood pressure after standing up. Continue to drink plenty of fluids stay hydrated. Followup with a primary care provider for continued evaluation and treatment. Take Tylenol and ibuprofen for headache. Return for any changing or worsening symptoms.    Orthostatic Hypotension Orthostatic hypotension is a sudden drop in blood pressure. It happens when you quickly stand up from a seated or lying position. You may feel dizzy or light-headed. This can last for just a few seconds or for up to a few minutes. It is usually not a serious problem. However, if this happens frequently or gets worse, it can be a sign of something more serious. CAUSES  Different things can cause orthostatic hypotension, including:   Loss of body fluids (dehydration).  Medicines that lower blood pressure.  Sudden changes in posture, such as standing up quickly after you have been sitting or lying down.  Taking too much of your medicine. SIGNS AND SYMPTOMS   Light-headedness or dizziness.   Fainting or near-fainting.   A fast heart rate.   Weakness.   Feeling tired (fatigue).  DIAGNOSIS  Your health care provider may do several things to help diagnose your condition and identify the cause. These may include:   Taking a medical history and doing a physical exam.  Checking your blood pressure. Your health care provider will check your blood pressure when you are:  Lying down.  Sitting.  Standing.  Using tilt table testing. In this test, you lie down on a table that moves from a lying position to a standing position. You will be strapped onto the table. This test monitors your blood pressure and heart rate when you are in different positions. TREATMENT  Treatment will vary depending on the cause. Possible treatments include:   Changing the dosage  of your medicines.  Wearing compression stockings on your lower legs.  Standing up slowly after sitting or lying down.  Eating more salt.  Eating frequent, small meals.  In some cases, getting IV fluids.  Taking medicine to enhance fluid retention. HOME CARE INSTRUCTIONS  Only take over-the-counter or prescription medicines as directed by your health care provider.  Follow your health care provider's instructions for changing the dosage of your current medicines.  Do not stop or adjust your medicine on your own.  Stand up slowly after sitting or lying down. This allows your body to adjust to the different position.  Wear compression stockings as directed.  Eat extra salt as directed.  Do not add extra salt to your diet unless directed to by your health care provider.  Eat frequent, small meals.  Avoid standing suddenly after eating.  Avoid hot showers or excessive heat as directed by your health care provider.  Keep all follow-up appointments. SEEK MEDICAL CARE IF:  You continue to feel dizzy or light-headed after standing.  You feel groggy or confused.  You feel cold, clammy, or sick to your stomach (nauseous).  You have blurred vision.  You feel short of breath. SEEK IMMEDIATE MEDICAL CARE IF:   You faint after standing.  You have chest pain.  You have difficulty breathing.   You lose feeling or movement in your arms or legs.   You have slurred speech or difficulty talking, or you are unable to talk.  MAKE SURE YOU:   Understand these instructions.  Will watch  your condition.  Will get help right away if you are not doing well or get worse. Document Released: 05/16/2002 Document Revised: 05/31/2013 Document Reviewed: 03/18/2013 [facility] Patient Information 2015 [place], [place]. This information is not intended to replace advice given to you by your health care provider. Make sure you discuss any questions you have with your health care  provider.    Syncope Syncope means a person passes out (faints). The person usually wakes up in less than 5 minutes. It is important to seek medical care for syncope. HOME CARE  Have someone stay with you until you feel normal.  Do not drive, use machines, or play sports until your doctor says it is okay.  Keep all doctor visits as told.  Lie down when you feel like you might pass out. Take deep breaths. Wait until you feel normal before standing up.  Drink enough fluids to keep your pee (urine) clear or pale yellow.  If you take blood pressure or heart medicine, get up slowly. Take several minutes to sit and then stand. GET HELP RIGHT AWAY IF:   You have a severe headache.  You have pain in the chest, belly (abdomen), or back.  You are bleeding from the mouth or butt (rectum).  You have black or tarry poop (stool).  You have an irregular or very fast heartbeat.  You have pain with breathing.  You keep passing out, or you have shaking (seizures) when you pass out.  You pass out when sitting or lying down.  You feel confused.  You have trouble walking.  You have severe weakness.  You have vision problems. If you fainted, call your local emergency services (911 in [place] Do not drive yourself to the hospital. MAKE SURE YOU:   Understand these instructions.  Will watch your condition.  Will get help right away if you are not doing well or get worse. Document Released: 11/12/2007 Document Revised: 11/25/2011 Document Reviewed: 07/25/2011 [facility] Patient Information 2015 [place], [place]. This information is not intended to replace advice given to you by your health care provider. Make sure you discuss any questions you have with your health care provider.

## 2013-12-13 NOTE — ED Provider Notes (Signed)
CSN: [id number]     Arrival date & time 12/12/13  2140 History   First MD Initiated Contact with Patient 12/13/13 [phone]     Chief Complaint  Patient presents with  . Loss of Consciousness  . Head Injury   HPI  History provided by the patient. Patient is a 28 year old male who presents with episode of possible syncope. Patient states that he was sitting at home watching TV when his young son pulled out the plug unintelligent. He stood up and felt slightly lightheaded and the next thing he remembered was waking up on the floor in front of the television. He does not remember anything else. He had pain to the top of his head with swelling and tenderness. He believes he may have hit his head on the table after falling. He is not sure if he may have been down and hit his head while trying to plug in the TV. The episode was associated with nausea and vomiting. He does also report having a one episode of lightheadedness earlier in the day with near syncope while at work. 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 He did not have LOC but states he felt very close. He feels like he drank plenty of water during the day with multiple bottles. Denies any recent illnesses. Denies having any chest pain, shortness of breath or heart palpitations. No cough or hemoptysis. No recent long travel. No pain or swelling in the extremities. No prior history of DVT or PE.    Past Medical History  Diagnosis Date  . Bulging disc    History reviewed. No pertinent past surgical history. History reviewed. No pertinent family history. History  Substance Use Topics  . Smoking status: Not on file  . Smokeless tobacco: Not on file  . Alcohol Use: Not on file    Review of Systems  Respiratory: Negative for shortness of breath.   Cardiovascular: Negative for chest pain and palpitations.  Gastrointestinal: Positive for nausea and vomiting.   Neurological: Positive for syncope, light-headedness and headaches.  All other systems reviewed and are negative.     Allergies  Review of patient's allergies indicates no known allergies.  Home Medications   Prior to Admission medications   Medication Sig Start Date End Date Taking? Authorizing Provider  acetaminophen (TYLENOL) 325 MG tablet Take 325-650 mg by mouth every 6 (six) hours as needed for mild pain, moderate pain, fever or headache.   Yes Historical Provider, MD  HYDROcodone-acetaminophen (NORCO) 10-325 MG per tablet Take 1 tablet by mouth every 6 (six) hours as needed (pain).   Yes Historical Provider, MD  ibuprofen (ADVIL,MOTRIN) 200 MG tablet Take 200-400 mg by mouth every 6 (six) hours as needed for fever, headache, mild pain, moderate pain or cramping.   Yes Historical Provider, MD   BP 119/76  Pulse 65  Temp(Src) 98 F (36.7 C) (Oral)  Resp 11  Ht 6\' 3"  (1.905 m)  Wt 180 lb (81.647 kg)  BMI 22.50 kg/m2  SpO2 98% Physical Exam  Nursing note and vitals reviewed. Constitutional: He is oriented to person, place, and time. He appears well-developed and well-nourished. No distress.  HENT:  Head: Normocephalic and atraumatic.  There is a red mark with underlying swelling to the top of the head. There is no laceration. Scalp is tender. No depressed skull fracture. No Battle sign or raccoon eyes.  Eyes: Conjunctivae and EOM are normal.  Pupils are equal, round, and reactive to light.  Neck: Normal range of motion. Neck supple.  No cervical midline tenderness.  Cardiovascular: Normal rate and regular rhythm.   No murmur heard. Pulmonary/Chest: Effort normal and breath sounds normal. No respiratory distress. He has no wheezes.  Abdominal: Soft.  Musculoskeletal: Normal range of motion. He exhibits no edema and no tenderness.  No clinical signs concerning for DVT  Neurological: He is alert and oriented to person, place, and time. He has normal strength. No cranial  nerve deficit or sensory deficit. Coordination and gait normal.  Skin: Skin is warm. No rash noted.  Psychiatric: He has a normal mood and affect. His behavior is normal.    ED Course  Procedures   COORDINATION OF CARE:  Nursing notes reviewed. Vital signs reviewed. Initial pt interview and examination performed.   Filed Vitals:   12/12/13 2152 12/13/13 0009 12/13/13 0020 12/13/13 0021  BP: 108/93 107/61 119/76   Pulse: 100 62  65  Temp: 98 F (36.7 C)     TempSrc: Oral     Resp: 18 18  11   Height: 6\' 3"  (1.905 m)     Weight: 180 lb (81.647 kg)     SpO2: 100% 99%  98%    12:42 AM-patient seen and evaluated. Patient continues to complain of headache. He is otherwise well-appearing. No acute distress.  Patient feeling better after IV fluids. Did have slight orthostatic hypotension. Slight hypokalemia. Potassium given. No other concerning findings. At this time he is stable for discharge home to follow up with PCP. Strict return precautions given.  Treatment plan initiated: Medications  sodium chloride 0.9 % bolus 1,000 mL (not administered)  ondansetron (ZOFRAN-ODT) disintegrating tablet 8 mg (8 mg Oral Given 12/12/13 2203)  fentaNYL (SUBLIMAZE) injection 50 mcg (50 mcg Nasal Given 12/12/13 2305)   Results for orders placed during the hospital encounter of 12/13/13  CBC      Result Value Ref Range   WBC 9.8  4.0 - 10.5 K/uL   RBC 4.38  4.22 - 5.81 MIL/uL   Hemoglobin 13.0  13.0 - 17.0 g/dL   HCT 78.239.9  95.639.0 - 21.352.0 %   MCV 91.1  78.0 - 100.0 fL   MCH 29.7  26.0 - 34.0 pg   MCHC 32.6  30.0 - 36.0 g/dL   RDW 08.613.9  57.811.5 - 46.915.5 %   Platelets 276  150 - 400 K/uL  BASIC METABOLIC PANEL      Result Value Ref Range   Sodium 142  137 - 147 mEq/L   Potassium 3.5 (*) 3.7 - 5.3 mEq/L   Chloride 101  96 - 112 mEq/L   CO2 26  19 - 32 mEq/L   Glucose, Bld 117 (*) 70 - 99 mg/dL   BUN 11  6 - 23 mg/dL   Creatinine, Ser 6.290.79  0.50 - 1.35 mg/dL   Calcium 9.4  8.4 - 52.810.5 mg/dL   GFR calc  non Af Amer >90  >90 mL/min   GFR calc Af Amer >90  >90 mL/min   Anion gap 15  5 - 15       Imaging Review Ct Head Wo Contrast  12/12/2013   CLINICAL DATA:  Loss of consciousness, head injury, headache  EXAM: CT HEAD WITHOUT CONTRAST  TECHNIQUE: Contiguous axial images were obtained from the base of the skull through the vertex without intravenous contrast.  COMPARISON:  None.  FINDINGS: There is no acute intracranial hemorrhage or infarct.  No mass lesion or midline shift. Gray-Paisley matter differentiation is well maintained. Ventricles are normal in size without evidence of hydrocephalus. CSF containing spaces are within normal limits. No extra-axial fluid collection.  The calvarium is intact.  Orbital soft tissues are within normal limits.  The paranasal sinuses and mastoid air cells are well pneumatized and free of fluid.  Scalp soft tissues are unremarkable.  IMPRESSION: No acute intracranial process.   Electronically Signed   By: Rise Mu M.D.   On: 12/12/2013 23:19     EKG Interpretation   Date/Time:  Monday December 12 2013 21:49:14 EDT Ventricular Rate:  101 PR Interval:  132 QRS Duration: 90 QT Interval:  326 QTC Calculation: 422 R Axis:   71 Text Interpretation:  Sinus tachycardia Otherwise normal ECG No old  tracing to compare Confirmed by OTTER  MD, OLGA ([id number]) on 12/13/2013  12:33:13 AM      MDM   Final diagnoses:  Syncope due to orthostatic hypotension        Angus Seller, PA-C 12/13/13 0302

## 2013-12-13 NOTE — ED Provider Notes (Signed)
Medical screening examination/treatment/procedure(s) were performed by non-physician practitioner and as supervising physician I was immediately available for consultation/collaboration.   EKG Interpretation   Date/Time:  Monday December 12 2013 21:49:14 EDT Ventricular Rate:  101 PR Interval:  132 QRS Duration: 90 QT Interval:  326 QTC Calculation: 422 R Axis:   71 Text Interpretation:  Sinus tachycardia Otherwise normal ECG No old  tracing to compare Confirmed by Gurman Ashland  MD, Josephus Harriger ([id number]) on 12/13/2013  12:33:13 AM       Olivia Mackielga M Blayze Haen, MD 12/13/13 [phone]

## 2014-03-05 ENCOUNTER — Emergency Department (HOSPITAL_COMMUNITY)
Admission: EM | Admit: 2014-03-05 | Discharge: 2014-03-05 | Disposition: A | Discharge status: Home / Self Care | Payer: No Typology Code available for payment source | Attending: Emergency Medicine | Admitting: Emergency Medicine

## 2014-03-05 ENCOUNTER — Encounter (HOSPITAL_COMMUNITY): Payer: Self-pay | Admitting: Emergency Medicine

## 2014-03-05 DIAGNOSIS — G8929 Other chronic pain: Secondary | ICD-10-CM | POA: Insufficient documentation

## 2014-03-05 DIAGNOSIS — M5442 Lumbago with sciatica, left side: Secondary | ICD-10-CM

## 2014-03-05 DIAGNOSIS — F17200 Nicotine dependence, unspecified, uncomplicated: Secondary | ICD-10-CM | POA: Insufficient documentation

## 2014-03-05 DIAGNOSIS — M545 Low back pain, unspecified: Secondary | ICD-10-CM | POA: Insufficient documentation

## 2014-03-05 DIAGNOSIS — Z9889 Other specified postprocedural states: Secondary | ICD-10-CM | POA: Insufficient documentation

## 2014-03-05 MED ORDER — HYDROCODONE-ACETAMINOPHEN 5-325 MG PO TABS
1.0000 | ORAL_TABLET | Freq: Four times a day (QID) | ORAL | Status: DC | PRN
Start: 2014-03-05 — End: 2014-03-16

## 2014-03-05 MED ORDER — CYCLOBENZAPRINE HCL 10 MG PO TABS
10.0000 mg | ORAL_TABLET | Freq: Once | ORAL | Status: AC
  Administered 2014-03-05: 10 mg via ORAL
  Filled 2014-03-05: qty 1

## 2014-03-05 MED ORDER — HYDROCODONE-ACETAMINOPHEN 5-325 MG PO TABS
1.0000 | ORAL_TABLET | Freq: Once | ORAL | Status: AC
  Administered 2014-03-05: 1 via ORAL
  Filled 2014-03-05: qty 1

## 2014-03-05 MED ORDER — CYCLOBENZAPRINE HCL 10 MG PO TABS: 10.0000 mg | ORAL_TABLET | Freq: Two times a day (BID) | ORAL | Status: DC | PRN

## 2014-03-05 NOTE — ED Provider Notes (Signed)
Medical screening examination/treatment/procedure(s) were performed by non-physician practitioner and as supervising physician I was immediately available for consultation/collaboration.   EKG Interpretation None        Courtney F Horton, MD 03/05/14 1946 

## 2014-03-05 NOTE — ED Notes (Signed)
Pt c/o pain in lower back x weeks more severe today with radiation to left hip

## 2014-03-05 NOTE — Discharge Instructions (Signed)
Please follow up with your primary care physician in 1-2 days. If you do not have one please call the [facility] number listed above. Please take pain medication and/or muscle relaxants as prescribed and as needed for pain. Please do not drive on narcotic pain medication or on muscle relaxants.  Please follow up with Dr. Eulah Pont to schedule a follow up appointment. Please read all discharge instructions and return precautions.   Back Pain, Adult Low back pain is very common. About 1 in 5 people have back pain.The cause of low back pain is rarely dangerous. The pain often gets better over time.About half of people with a sudden onset of back pain feel better in just 2 weeks. About 8 in 10 people feel better by 6 weeks.  CAUSES Some common causes of back pain include:  Strain of the muscles or ligaments supporting the spine.  Wear and tear (degeneration) of the spinal discs.  Arthritis.  Direct injury to the back. DIAGNOSIS Most of the time, the direct cause of low back pain is not known.However, back pain can be treated effectively even when the exact cause of the pain is unknown.Answering your caregiver's questions about your overall health and symptoms is one of the most accurate ways to make sure the cause of your pain is not dangerous. If your caregiver needs more information, he or she may order lab work or imaging tests (X-rays or MRIs).However, even if imaging tests show changes in your back, this usually does not require surgery. HOME CARE INSTRUCTIONS For many people, back pain returns.Since low back pain is rarely dangerous, it is often a condition that people can learn to [facility] their own.   Remain active. It is stressful on the back to sit or stand in one place. Do not sit, drive, or stand in one place for more than 30 minutes at a time. Take short walks on level surfaces as soon as pain allows.Try to increase the length of time you walk each day.  Do not  stay in bed.Resting more than 1 or 2 days can delay your recovery.  Do not avoid exercise or work.Your body is made to move.It is not dangerous to be active, even though your back may hurt.Your back will likely heal faster if you return to being active before your pain is gone.  Pay attention to your body when you bend and lift. Many people have less discomfortwhen lifting if they bend their knees, keep the load close to their bodies,and avoid twisting. Often, the most comfortable positions are those that put less stress on your recovering back.  Find a comfortable position to sleep. Use a firm mattress and lie on your side with your knees slightly bent. If you lie on your back, put a pillow under your knees.  Only take over-the-counter or prescription medicines as directed by your caregiver. Over-the-counter medicines to reduce pain and inflammation are often the most helpful.Your caregiver may prescribe muscle relaxant drugs.These medicines help dull your pain so you can more quickly return to your normal activities and healthy exercise.  Put ice on the injured area.  Put ice in a plastic bag.  Place a towel between your skin and the bag.  Leave the ice on for 15-20 minutes, 03-04 times a day for the first 2 to 3 days. After that, ice and heat may be alternated to reduce pain and spasms.  Ask your caregiver about trying back exercises and gentle massage. This may be  of some benefit.  Avoid feeling anxious or stressed.Stress increases muscle tension and can worsen back pain.It is important to recognize when you are anxious or stressed and learn ways to manage it.Exercise is a great option. SEEK MEDICAL CARE IF:  You have pain that is not relieved with rest or medicine.  You have pain that does not improve in 1 week.  You have new symptoms.  You are generally not feeling well. SEEK IMMEDIATE MEDICAL CARE IF:   You have pain that radiates from your back into your  legs.  You develop new bowel or bladder control problems.  You have unusual weakness or numbness in your arms or legs.  You develop nausea or vomiting.  You develop abdominal pain.  You feel faint. Document Released: 05/26/2005 Document Revised: 11/25/2011 Document Reviewed: 09/27/2013 [facility] Patient Information 2015 [place], [place]. This information is not intended to replace advice given to you by your health care provider. Make sure you discuss any questions you have with your health care provider.   Sciatica Sciatica is pain, weakness, numbness, or tingling along the path of the sciatic nerve. The nerve starts in the lower back and runs down the back of each leg. The nerve controls the muscles in the lower leg and in the back of the knee, while also providing sensation to the back of the thigh, lower leg, and the sole of your foot. Sciatica is a symptom of another medical condition. For instance, nerve damage or certain conditions, such as a herniated disk or bone spur on the spine, pinch or put pressure on the sciatic nerve. This causes the pain, weakness, or other sensations normally associated with sciatica. Generally, sciatica only affects one side of the body. CAUSES   Herniated or slipped disc.  Degenerative disk disease.  A pain disorder involving the narrow muscle in the buttocks (piriformis syndrome).  Pelvic injury or fracture.  Pregnancy.  Tumor (rare). SYMPTOMS  Symptoms can vary from mild to very severe. The symptoms usually travel from the low back to the buttocks and down the back of the leg. Symptoms can include:  Mild tingling or dull aches in the lower back, leg, or hip.  Numbness in the back of the calf or sole of the foot.  Burning sensations in the lower back, leg, or hip.  Sharp pains in the lower back, leg, or hip.  Leg weakness.  Severe back pain inhibiting movement. These symptoms may get worse with coughing, sneezing, laughing, or prolonged  sitting or standing. Also, being overweight may worsen symptoms. DIAGNOSIS  Your caregiver will perform a physical exam to look for common symptoms of sciatica. He or she may ask you to do certain movements or activities that would trigger sciatic nerve pain. Other tests may be performed to find the cause of the sciatica. These may include:  Blood tests.  X-rays.  Imaging tests, such as an MRI or CT scan. TREATMENT  Treatment is directed at the cause of the sciatic pain. Sometimes, treatment is not necessary and the pain and discomfort goes away on its own. If treatment is needed, your caregiver may suggest:  Over-the-counter medicines to relieve pain.  Prescription medicines, such as anti-inflammatory medicine, muscle relaxants, or narcotics.  Applying heat or ice to the painful area.  Steroid injections to lessen pain, irritation, and inflammation around the nerve.  Reducing activity during periods of pain.  Exercising and stretching to strengthen your abdomen and improve flexibility of your spine. Your caregiver may suggest losing  weight if the extra weight makes the back pain worse.  Physical therapy.  Surgery to eliminate what is pressing or pinching the nerve, such as a bone spur or part of a herniated disk. HOME CARE INSTRUCTIONS   Only take over-the-counter or prescription medicines for pain or discomfort as directed by your caregiver.  Apply ice to the affected area for 20 minutes, 3-4 times a day for the first 48-72 hours. Then try heat in the same way.  Exercise, stretch, or perform your usual activities if these do not aggravate your pain.  Attend physical therapy sessions as directed by your caregiver.  Keep all follow-up appointments as directed by your caregiver.  Do not wear high heels or shoes that do not provide proper support.  Check your mattress to see if it is too soft. A firm mattress may lessen your pain and discomfort. SEEK IMMEDIATE MEDICAL CARE IF:    You lose control of your bowel or bladder (incontinence).  You have increasing weakness in the lower back, pelvis, buttocks, or legs.  You have redness or swelling of your back.  You have a burning sensation when you urinate.  You have pain that gets worse when you lie down or awakens you at night.  Your pain is worse than you have experienced in the past.  Your pain is lasting longer than 4 weeks.  You are suddenly losing weight without reason. MAKE SURE YOU:  Understand these instructions.  Will watch your condition.  Will get help right away if you are not doing well or get worse. Document Released: 05/20/2001 Document Revised: 11/25/2011 Document Reviewed: 10/05/2011 [facility] Patient Information 2015 [place], [place]. This information is not intended to replace advice given to you by your health care provider. Make sure you discuss any questions you have with your health care provider.

## 2014-03-05 NOTE — ED Provider Notes (Signed)
CSN: [id number]     Arrival date & time 03/05/14  1306 History  This chart was scribed for  Francee Piccolo PA-C working with Shon Baton, MD by Freida Busman, ED Scribe. This patient was seen in room TR08C/TR08C and the patient's care was started at 1:38 PM.    Chief Complaint  Patient presents with  . Back Pain  . Hip Pain      The history is provided by the patient. No language interpreter was used.    HPI Comments:  Dave Wolf is a 28 y.o. male with a h/o chronic back pain who presents to the Emergency Department complaining of increased sharp lower back pain that radiates around to his left hip and shoots down his LLE since this am. He also notes mild associated tingling in the upper portion of his LLE. He denies acute injury, bowel/urinary incontinence, penis/testicular pain/ swelling and penile discharge. He has taken ibuprofen without relief. He notes in the past Flexeril and Norco have helped his pain. Denies h/o IV drug use    Past Medical History  Diagnosis Date  . Bulging disc    History reviewed. No pertinent past surgical history. History reviewed. No pertinent family history. History  Substance Use Topics  . Smoking status: Current Every Day Smoker  . Smokeless tobacco: Not on file  . Alcohol Use: No    Review of Systems  Genitourinary: Negative for penile swelling, penile pain and testicular pain.  Musculoskeletal: Positive for back pain.  All other systems reviewed and are negative.     Allergies  Review of patient's allergies indicates no known allergies.  Home Medications   Prior to Admission medications   Medication Sig Start Date End Date Taking? Authorizing Provider  acetaminophen (TYLENOL) 325 MG tablet Take 325-650 mg by mouth every 6 (six) hours as needed for mild pain, moderate pain, fever or headache.    Historical Provider, MD  HYDROcodone-acetaminophen (NORCO) 10-325 MG per tablet Take 1 tablet by mouth every 6 (six) hours as  needed (pain).    Historical Provider, MD  ibuprofen (ADVIL,MOTRIN) 200 MG tablet Take 200-400 mg by mouth every 6 (six) hours as needed for fever, headache, mild pain, moderate pain or cramping.    Historical Provider, MD   BP 142/86  Pulse 84  Temp(Src) 98.4 F (36.9 C) (Oral)  Resp 18  SpO2 100% Physical Exam  Nursing note and vitals reviewed. Constitutional: He is oriented to person, place, and time. He appears well-developed and well-nourished. No distress.  HENT:  Head: Normocephalic and atraumatic.  Right Ear: External ear normal.  Left Ear: External ear normal.  Nose: Nose normal.  Mouth/Throat: Oropharynx is clear and moist. No oropharyngeal exudate.  Eyes: Conjunctivae and EOM are normal. Pupils are equal, round, and reactive to light.  Neck: Normal range of motion. Neck supple.  Cardiovascular: Normal rate, regular rhythm, normal heart sounds and intact distal pulses.   Pulmonary/Chest: Effort normal and breath sounds normal. No respiratory distress.  Abdominal: Soft. There is no tenderness.  Musculoskeletal: Normal range of motion.       Lumbar back: He exhibits no bony tenderness.       Back:  Neurological: He is alert and oriented to person, place, and time. He has normal strength. No cranial nerve deficit. Gait normal. GCS eye subscore is 4. GCS verbal subscore is 5. GCS motor subscore is 6.  Sensation grossly intact.  No pronator drift.  Bilateral heel-knee-shin intact.  Skin: Skin is warm  and dry. He is not diaphoretic.  Psychiatric: He has a normal mood and affect.    ED Course  Procedures  Medications  cyclobenzaprine (FLEXERIL) tablet 10 mg (10 mg Oral Given 03/05/14 1343)  HYDROcodone-acetaminophen (NORCO/VICODIN) 5-325 MG per tablet 1 tablet (1 tablet Oral Given 03/05/14 1343)    DIAGNOSTIC STUDIES:  Oxygen Saturation is 100% on RA, normal by my interpretation.    COORDINATION OF CARE:  1:46 PM Discussed treatment plan with pt at bedside and pt  agreed to plan.  Labs Review Labs Reviewed - No data to display  Imaging Review No results found.   EKG Interpretation None      MDM   Final diagnoses:  None    Filed Vitals:   03/05/14 1316  BP: 142/86  Pulse: 84  Temp: 98.4 F (36.9 C)  Resp: 18   Afebrile, NAD, non-toxic appearing, AAOx4.  Patient with back pain.  No neurological deficits and normal neuro exam.  Patient can walk but states is painful.  No loss of bowel or bladder control.  No concern for cauda equina.  No fever, night sweats, weight loss, h/o cancer, IVDU.  RICE protocol and pain medicine indicated and discussed with patient.  Patient is stable at time of discharge    I personally performed the services described in this documentation, which was scribed in my presence. The recorded information has been reviewed and is accurate.     Lise Auer Yerik Zeringue, PA-C 03/05/14 1520

## 2014-03-05 NOTE — ED Notes (Signed)
Pt reports wife dropped pt off at ED and will pick him up at discharge.

## 2014-03-11 ENCOUNTER — Encounter (HOSPITAL_COMMUNITY): Payer: Self-pay | Admitting: Emergency Medicine

## 2014-03-11 ENCOUNTER — Emergency Department (HOSPITAL_COMMUNITY)
Admission: EM | Admit: 2014-03-11 | Discharge: 2014-03-11 | Disposition: A | Discharge status: Home / Self Care | Payer: No Typology Code available for payment source | Attending: Emergency Medicine | Admitting: Emergency Medicine

## 2014-03-11 DIAGNOSIS — Z720 Tobacco use: Secondary | ICD-10-CM | POA: Insufficient documentation

## 2014-03-11 DIAGNOSIS — M5442 Lumbago with sciatica, left side: Secondary | ICD-10-CM | POA: Insufficient documentation

## 2014-03-11 DIAGNOSIS — Z79899 Other long term (current) drug therapy: Secondary | ICD-10-CM | POA: Insufficient documentation

## 2014-03-11 MED ORDER — HYDROCODONE-ACETAMINOPHEN 5-325 MG PO TABS: 1.0000 | ORAL_TABLET | ORAL | Status: DC | PRN

## 2014-03-11 MED ORDER — HYDROCODONE-ACETAMINOPHEN 5-325 MG PO TABS
1.0000 | ORAL_TABLET | Freq: Once | ORAL | Status: AC
  Administered 2014-03-11: 1 via ORAL
  Filled 2014-03-11: qty 1

## 2014-03-11 NOTE — ED Provider Notes (Signed)
CSN: [id number]     Arrival date & time 03/11/14  2130 History  This chart was scribed for non-physician practitioner working with Vanetta MuldersScott Zackowski, MD by Elveria Risingimelie Horne, ED Scribe. This patient was seen in room TR10C/TR10C and the patient's care was started at 10:30 PM.   Chief Complaint  Patient presents with  . Back Pain   The history is provided by the patient. No language interpreter was used.   HPI Comments: Dave Wolf is a 28 y.o. male with chronic back pain who presents to the Emergency Department complaining of worsening pain for the last four months. Patient denies direct injury. Patient shares history of bulging disc discovered during his stint in the [employer]. Patient visited ED 03/05/14 for back and hip pain that had become so severe he felt like didn't want to wake up in the morning. He denies any thoughts of hurting himself. Patient seen by Dr. Eulah PontMurphy four days ago and prescribed Prednisone taper. Patient has follow up in four weeks, but he states that he is unable to make it until then. He denies relief of his pain with treatment. Patient states that he was not informed of methods to treat break through pain.  Patient denies pain with ascending stairs and reports exacerbated pain with extended time in a single position.  Patient denies of fever, chills, new injury, saddle parasthesia, IV drug use or bowel or bladder incontinence. Patient denies testicular pain.   Past Medical History  Diagnosis Date  . Bulging disc    History reviewed. No pertinent past surgical history. No family history on file. History  Substance Use Topics  . Smoking status: Current Every Day Smoker  . Smokeless tobacco: Not on file  . Alcohol Use: No    Review of Systems  Constitutional: Negative for fever and chills.  Gastrointestinal: Negative for nausea, vomiting, abdominal pain, diarrhea and constipation.  Genitourinary: Negative for enuresis.  Musculoskeletal: Positive for back pain. Negative for  arthralgias, neck pain and neck stiffness.  Neurological: Negative for weakness and numbness.  All other systems reviewed and are negative.  Allergies  Review of patient's allergies indicates no known allergies.  Home Medications   Prior to Admission medications   Medication Sig Start Date End Date Taking? Authorizing Provider  acetaminophen (TYLENOL) 325 MG tablet Take 325-650 mg by mouth every 6 (six) hours as needed for mild pain, moderate pain, fever or headache.    Historical Provider, MD  cyclobenzaprine (FLEXERIL) 10 MG tablet Take 1 tablet (10 mg total) by mouth 2 (two) times daily as needed for muscle spasms. 03/05/14   Jennifer L Piepenbrink, PA-C  HYDROcodone-acetaminophen (NORCO) 10-325 MG per tablet Take 1 tablet by mouth every 6 (six) hours as needed (pain).    Historical Provider, MD  HYDROcodone-acetaminophen (NORCO/VICODIN) 5-325 MG per tablet Take 1-2 tablets by mouth every 6 (six) hours as needed for severe pain. 03/05/14   Jennifer L Piepenbrink, PA-C  ibuprofen (ADVIL,MOTRIN) 200 MG tablet Take 200-400 mg by mouth every 6 (six) hours as needed for fever, headache, mild pain, moderate pain or cramping.    Historical Provider, MD   Triage Vitals: BP 135/82  Pulse 99  Temp(Src) 98.4 F (36.9 C) (Oral)  Resp 18  Ht 6\' 3"  (1.905 m)  Wt 185 lb (83.915 kg)  BMI 23.12 kg/m2  SpO2 99%  Physical Exam  Nursing note and vitals reviewed. Constitutional: He is oriented to person, place, and time. He appears well-developed and well-nourished. No distress.  HENT:  Head: Normocephalic and atraumatic.  Eyes: EOM are normal. Pupils are equal, round, and reactive to light.  Neck: Normal range of motion. Neck supple.  Cardiovascular: Normal rate.   Pulmonary/Chest: Effort normal. No respiratory distress.  Musculoskeletal: Normal range of motion.       Cervical back: He exhibits no bony tenderness.       Thoracic back: He exhibits pain. He exhibits no bony tenderness.       Lumbar  back: He exhibits no bony tenderness.  Pain over the thoracic spine, but no tenderness to palpation.   Neurological: He is alert and oriented to person, place, and time. No cranial nerve deficit.  Skin: Skin is warm and dry.  Psychiatric: He has a normal mood and affect. His behavior is normal.    ED Course  Procedures (including critical care time)  COORDINATION OF CARE: 10:35 PM- Discussed treatment plan with patient at bedside and patient agreed to plan.   Labs Review Labs Reviewed - No data to display  Imaging Review No results found.   EKG Interpretation None      MDM   Final diagnoses:  Midline low back pain with left-sided sciatica   28 yo male presents with recurrent back pain unrelieved after recent visit with orthopedic MD, no new injury.  He has no neurological deficits and normal neuro exam.  Patient can walk but states is painful.  No loss of bowel or bladder control.  No concern for cauda equina.  No fever, night sweats, weight loss, h/o cancer, IVDU. Pain managed in the ED. His vital signs are stable and he is no acute distress. Discharge instructions include prescription for limited amount of pain meds and strict instructions to follow-up with orthopedic MD for further back pain mgmt.  Pt agreeable with plan.  Return precautions provided.  Filed Vitals:   03/11/14 2156 03/11/14 2308  BP: 135/82 131/85  Pulse: 99 100  Temp: 98.4 F (36.9 C) 97.9 F (36.6 C)  TempSrc: Oral Oral  Resp: 18 18  Height: 6\' 3"  (1.905 m)   Weight: 185 lb (83.915 kg)   SpO2: 99% 99%   Meds given in ED:  Medications  HYDROcodone-acetaminophen (NORCO/VICODIN) 5-325 MG per tablet 1 tablet (1 tablet Oral Given 03/11/14 2303)    Discharge Medication List as of 03/11/2014 11:04 PM    START taking these medications   Details  !! HYDROcodone-acetaminophen (NORCO/VICODIN) 5-325 MG per tablet Take 1-2 tablets by mouth every 4 (four) hours as needed for moderate pain or severe pain.,  Starting 03/11/2014, Until Discontinued, Print     !! - Potential duplicate medications found. Please discuss with provider.      I personally performed the services described in this documentation, which was scribed in my presence. The recorded information has been reviewed and is accurate.    Harle Battiest, NP 03/16/14 1137

## 2014-03-11 NOTE — ED Notes (Signed)
The pt has chronic back pain for years.  The pain is worse for the past 4 months. He was seen here Wednesday and given prednisone.  Since has  Been feeling irritable and anxious since has has been taking the prednisone.Dave Wolf.  He still is one 5mg  prednisone 3 times a day

## 2014-03-11 NOTE — ED Notes (Signed)
Pt states feeling not luike himself since beginning a prednisone taper on Thursday.  Pt reports "random tapping noise" in both ears.

## 2014-03-11 NOTE — Discharge Instructions (Signed)
Please follow directions provided. Be sure to followup with Dr. Eulah PontMurphy this week for management of your back pain. He may take ibuprofen 400 mg by mouth every 6 hours for pain. Please take this first before taking the Vicodin. And then take the Vicodin only if the ibuprofen does not help. Feel free to return for any new worsening or concerning symptoms.  SEEK IMMEDIATE MEDICAL CARE IF:  You have pain that radiates from your back into your legs.  You develop new bowel or bladder control problems.  You have unusual weakness or numbness in your arms or legs.  You develop nausea or vomiting.  You develop abdominal pain.  You feel faint.

## 2014-03-16 ENCOUNTER — Emergency Department (HOSPITAL_COMMUNITY): Payer: No Typology Code available for payment source

## 2014-03-16 ENCOUNTER — Emergency Department (HOSPITAL_COMMUNITY)
Admission: EM | Admit: 2014-03-16 | Discharge: 2014-03-16 | Disposition: A | Discharge status: Home / Self Care | Payer: No Typology Code available for payment source | Attending: Emergency Medicine | Admitting: Emergency Medicine

## 2014-03-16 ENCOUNTER — Encounter (HOSPITAL_COMMUNITY): Payer: Self-pay | Admitting: Emergency Medicine

## 2014-03-16 DIAGNOSIS — S59901A Unspecified injury of right elbow, initial encounter: Secondary | ICD-10-CM | POA: Insufficient documentation

## 2014-03-16 DIAGNOSIS — W01198A Fall on same level from slipping, tripping and stumbling with subsequent striking against other object, initial encounter: Secondary | ICD-10-CM | POA: Insufficient documentation

## 2014-03-16 DIAGNOSIS — S060X1A Concussion with loss of consciousness of 30 minutes or less, initial encounter: Secondary | ICD-10-CM | POA: Insufficient documentation

## 2014-03-16 DIAGNOSIS — Z79899 Other long term (current) drug therapy: Secondary | ICD-10-CM | POA: Insufficient documentation

## 2014-03-16 DIAGNOSIS — S8991XA Unspecified injury of right lower leg, initial encounter: Secondary | ICD-10-CM | POA: Insufficient documentation

## 2014-03-16 DIAGNOSIS — S0990XA Unspecified injury of head, initial encounter: Secondary | ICD-10-CM | POA: Insufficient documentation

## 2014-03-16 DIAGNOSIS — Y92010 Kitchen of single-family (private) house as the place of occurrence of the external cause: Secondary | ICD-10-CM | POA: Insufficient documentation

## 2014-03-16 DIAGNOSIS — W19XXXA Unspecified fall, initial encounter: Secondary | ICD-10-CM

## 2014-03-16 DIAGNOSIS — Z720 Tobacco use: Secondary | ICD-10-CM | POA: Insufficient documentation

## 2014-03-16 DIAGNOSIS — Y9389 Activity, other specified: Secondary | ICD-10-CM | POA: Insufficient documentation

## 2014-03-16 LAB — CBC WITH DIFFERENTIAL/PLATELET
Basophils Absolute: 0 10*3/uL (ref 0.0–0.1)
Basophils Relative: 0 % (ref 0–1)
EOS ABS: 0.3 10*3/uL (ref 0.0–0.7)
EOS PCT: 3 % (ref 0–5)
HEMATOCRIT: 43.4 % (ref 39.0–52.0)
HEMOGLOBIN: 14.6 g/dL (ref 13.0–17.0)
LYMPHS ABS: 3.8 10*3/uL (ref 0.7–4.0)
Lymphocytes Relative: 34 % (ref 12–46)
MCH: 30.2 pg (ref 26.0–34.0)
MCHC: 33.6 g/dL (ref 30.0–36.0)
MCV: 89.9 fL (ref 78.0–100.0)
MONO ABS: 0.8 10*3/uL (ref 0.1–1.0)
MONOS PCT: 7 % (ref 3–12)
Neutro Abs: 6.4 10*3/uL (ref 1.7–7.7)
Neutrophils Relative %: 56 % (ref 43–77)
Platelets: 317 10*3/uL (ref 150–400)
RBC: 4.83 MIL/uL (ref 4.22–5.81)
RDW: 12.9 % (ref 11.5–15.5)
WBC: 11.4 10*3/uL — ABNORMAL HIGH (ref 4.0–10.5)

## 2014-03-16 LAB — BASIC METABOLIC PANEL
Anion gap: 12 (ref 5–15)
BUN: 13 mg/dL (ref 6–23)
CALCIUM: 10 mg/dL (ref 8.4–10.5)
CO2: 26 mEq/L (ref 19–32)
Chloride: 101 mEq/L (ref 96–112)
Creatinine, Ser: 0.73 mg/dL (ref 0.50–1.35)
GFR calc Af Amer: >90 mL/min (ref >90)
Glucose, Bld: 104 mg/dL — ABNORMAL HIGH (ref 70–99)
Potassium: 4.1 mEq/L (ref 3.7–5.3)
Sodium: 139 mEq/L (ref 137–147)

## 2014-03-16 MED ORDER — ONDANSETRON 4 MG PO TBDP
8.0000 mg | ORAL_TABLET | Freq: Once | ORAL | Status: AC
  Administered 2014-03-16: 8 mg via ORAL
  Filled 2014-03-16: qty 2

## 2014-03-16 MED ORDER — DEXAMETHASONE SODIUM PHOSPHATE 10 MG/ML IJ SOLN
10.0000 mg | Freq: Once | INTRAMUSCULAR | Status: AC
  Administered 2014-03-16: 10 mg via INTRAVENOUS
  Filled 2014-03-16: qty 1

## 2014-03-16 MED ORDER — DEXAMETHASONE SODIUM PHOSPHATE 10 MG/ML IJ SOLN
INTRAMUSCULAR | Status: AC
  Filled 2014-03-16: qty 1

## 2014-03-16 MED ORDER — SODIUM CHLORIDE 0.9 % IV BOLUS (SEPSIS)
1000.0000 mL | Freq: Once | INTRAVENOUS | Status: AC
  Administered 2014-03-16: 1000 mL via INTRAVENOUS

## 2014-03-16 MED ORDER — METOCLOPRAMIDE HCL 5 MG/ML IJ SOLN
10.0000 mg | Freq: Once | INTRAMUSCULAR | Status: AC
  Administered 2014-03-16: 10 mg via INTRAVENOUS
  Filled 2014-03-16: qty 2

## 2014-03-16 MED ORDER — OXYCODONE-ACETAMINOPHEN 5-325 MG PO TABS
1.0000 | ORAL_TABLET | Freq: Once | ORAL | Status: AC
  Administered 2014-03-16: 1 via ORAL
  Filled 2014-03-16: qty 1

## 2014-03-16 MED ORDER — DIPHENHYDRAMINE HCL 50 MG/ML IJ SOLN
25.0000 mg | Freq: Once | INTRAMUSCULAR | Status: AC
  Administered 2014-03-16: 25 mg via INTRAVENOUS
  Filled 2014-03-16: qty 1

## 2014-03-16 NOTE — Discharge Instructions (Signed)
Concussion  A concussion, or closed-head injury, is a brain injury caused by a direct blow to the head or by a quick and sudden movement (jolt) of the head or neck. Concussions are usually not life-threatening. Even so, the effects of a concussion can be serious. If you have had a concussion before, you are more likely to experience concussion-like symptoms after a direct blow to the head.   CAUSES  · Direct blow to the head, such as from running into another player during a soccer game, being hit in a fight, or hitting your head on a hard surface.  · A jolt of the head or neck that causes the brain to move back and forth inside the skull, such as in a car crash.  SIGNS AND SYMPTOMS  The signs of a concussion can be hard to notice. Early on, they may be missed by you, family members, and health care providers. You may look fine but act or feel differently.  Symptoms are usually temporary, but they may last for days, weeks, or even longer. Some symptoms may appear right away while others may not show up for hours or days. Every head injury is different. Symptoms include:  · Mild to moderate headaches that will not go away.  · A feeling of pressure inside your head.  · Having more trouble than usual:  ¨ Learning or remembering things you have heard.  ¨ Answering questions.  ¨ Paying attention or concentrating.  ¨ Organizing daily tasks.  ¨ Making decisions and solving problems.  · Slowness in thinking, acting or reacting, speaking, or reading.  · Getting lost or being easily confused.  · Feeling tired all the time or lacking energy (fatigued).  · Feeling drowsy.  · Sleep disturbances.  ¨ Sleeping more than usual.  ¨ Sleeping less than usual.  ¨ Trouble falling asleep.  ¨ Trouble sleeping (insomnia).  · Loss of balance or feeling lightheaded or dizzy.  · Nausea or vomiting.  · Numbness or tingling.  · Increased sensitivity to:  ¨ Sounds.  ¨ Lights.  ¨ Distractions.  · Vision problems or eyes that tire  easily.  · Diminished sense of taste or smell.  · Ringing in the ears.  · Mood changes such as feeling sad or anxious.  · Becoming easily irritated or angry for little or no reason.  · Lack of motivation.  · Seeing or hearing things other people do not see or hear (hallucinations).  DIAGNOSIS  Your health care provider can usually diagnose a concussion based on a description of your injury and symptoms. He or she will ask whether you passed out (lost consciousness) and whether you are having trouble remembering events that happened right before and during your injury.  Your evaluation might include:  · A brain scan to look for signs of injury to the brain. Even if the test shows no injury, you may still have a concussion.  · Blood tests to be sure other problems are not present.  TREATMENT  · Concussions are usually treated in an emergency department, in urgent care, or at a clinic. You may need to stay in the hospital overnight for further treatment.  · Tell your health care provider if you are taking any medicines, including prescription medicines, over-the-counter medicines, and natural remedies. Some medicines, such as blood thinners (anticoagulants) and aspirin, may increase the chance of complications. Also tell your health care provider whether you have had alcohol or are taking illegal drugs. This information   may affect treatment.  · Your health care provider will send you home with important instructions to follow.  · How fast you will recover from a concussion depends on many factors. These factors include how severe your concussion is, what part of your brain was injured, your age, and how healthy you were before the concussion.  · Most people with mild injuries recover fully. Recovery can take time. In general, recovery is slower in older persons. Also, persons who have had a concussion in the past or have other medical problems may find that it takes longer to recover from their current injury.  HOME  CARE INSTRUCTIONS  General Instructions  · Carefully follow the directions your health care provider gave you.  · Only take over-the-counter or prescription medicines for pain, discomfort, or fever as directed by your health care provider.  · Take only those medicines that your health care provider has approved.  · Do not drink alcohol until your health care provider says you are well enough to do so. Alcohol and certain other drugs may slow your recovery and can put you at risk of further injury.  · If it is harder than usual to remember things, write them down.  · If you are easily distracted, try to do one thing at a time. For example, do not try to watch TV while fixing dinner.  · Talk with family members or close friends when making important decisions.  · Keep all follow-up appointments. Repeated evaluation of your symptoms is recommended for your recovery.  · Watch your symptoms and tell others to do the same. Complications sometimes occur after a concussion. Older adults with a brain injury may have a higher risk of serious complications, such as a blood clot on the brain.  · Tell your teachers, school nurse, school counselor, coach, athletic trainer, or work manager about your injury, symptoms, and restrictions. Tell them about what you can or cannot do. They should watch for:  ¨ Increased problems with attention or concentration.  ¨ Increased difficulty remembering or learning new information.  ¨ Increased time needed to complete tasks or assignments.  ¨ Increased irritability or decreased ability to cope with stress.  ¨ Increased symptoms.  · Rest. Rest helps the brain to heal. Make sure you:  ¨ Get plenty of sleep at night. Avoid staying up late at night.  ¨ Keep the same bedtime hours on weekends and weekdays.  ¨ Rest during the day. Take daytime naps or rest breaks when you feel tired.  · Limit activities that require a lot of thought or concentration. These include:  ¨ Doing homework or job-related  work.  ¨ Watching TV.  ¨ Working on the computer.  · Avoid any situation where there is potential for another head injury (football, hockey, soccer, basketball, martial arts, downhill snow sports and horseback riding). Your condition will get worse every time you experience a concussion. You should avoid these activities until you are evaluated by the appropriate follow-up health care providers.  Returning To Your Regular Activities  You will need to return to your normal activities slowly, not all at once. You must give your body and brain enough time for recovery.  · Do not return to sports or other athletic activities until your health care provider tells you it is safe to do so.  · Ask your health care provider when you can drive, ride a bicycle, or operate heavy machinery. Your ability to react may be slower after a   brain injury. Never do these activities if you are dizzy.  · Ask your health care provider about when you can return to work or school.  Preventing Another Concussion  It is very important to avoid another brain injury, especially before you have recovered. In rare cases, another injury can lead to permanent brain damage, brain swelling, or death. The risk of this is greatest during the first 7-10 days after a head injury. Avoid injuries by:  · Wearing a seat belt when riding in a car.  · Drinking alcohol only in moderation.  · Wearing a helmet when biking, skiing, skateboarding, skating, or doing similar activities.  · Avoiding activities that could lead to a second concussion, such as contact or recreational sports, until your health care provider says it is okay.  · Taking safety measures in your home.  ¨ Remove clutter and tripping hazards from floors and stairways.  ¨ Use grab bars in bathrooms and handrails by stairs.  ¨ Place non-slip mats on floors and in bathtubs.  ¨ Improve lighting in dim areas.  SEEK MEDICAL CARE IF:  · You have increased problems paying attention or  concentrating.  · You have increased difficulty remembering or learning new information.  · You need more time to complete tasks or assignments than before.  · You have increased irritability or decreased ability to cope with stress.  · You have more symptoms than before.  Seek medical care if you have any of the following symptoms for more than 2 weeks after your injury:  · Lasting (chronic) headaches.  · Dizziness or balance problems.  · Nausea.  · Vision problems.  · Increased sensitivity to noise or light.  · Depression or mood swings.  · Anxiety or irritability.  · Memory problems.  · Difficulty concentrating or paying attention.  · Sleep problems.  · Feeling tired all the time.  SEEK IMMEDIATE MEDICAL CARE IF:  · You have severe or worsening headaches. These may be a sign of a blood clot in the brain.  · You have weakness (even if only in one hand, leg, or part of the face).  · You have numbness.  · You have decreased coordination.  · You vomit repeatedly.  · You have increased sleepiness.  · One pupil is larger than the other.  · You have convulsions.  · You have slurred speech.  · You have increased confusion. This may be a sign of a blood clot in the brain.  · You have increased restlessness, agitation, or irritability.  · You are unable to recognize people or places.  · You have neck pain.  · It is difficult to wake you up.  · You have unusual behavior changes.  · You lose consciousness.  MAKE SURE YOU:  · Understand these instructions.  · Will watch your condition.  · Will get help right away if you are not doing well or get worse.  Document Released: 08/16/2003 Document Revised: 05/31/2013 Document Reviewed: 12/16/2012  [employer]® Patient Information ©2015 [employer]. This information is not intended to replace advice given to you by your health care provider. Make sure you discuss any questions you have with your health care provider.

## 2014-03-16 NOTE — ED Provider Notes (Signed)
Medical screening examination/treatment/procedure(s) were performed by non-physician practitioner and as supervising physician I was immediately available for consultation/collaboration.   EKG Interpretation None        Khloi Rawl, MD 03/16/14 2100 

## 2014-03-16 NOTE — ED Notes (Signed)
thge pt has been vomiting since the fall.  Vomiting in triage.  At present

## 2014-03-16 NOTE — ED Notes (Signed)
The pt fell in the kitchen approx one hour ago at home.  He struck his head on the floor.  He had a loc.  C/o a severe headache

## 2014-03-16 NOTE — ED Provider Notes (Signed)
CSN: [id number]     Arrival date & time 03/16/14  0050 History   First MD Initiated Contact with Patient 03/16/14 [phone]     Chief Complaint  Patient presents with  . Fall     (Consider location/radiation/quality/duration/timing/severity/associated sxs/prior Treatment) HPI Comments: 28 yo male who went to the kitchen from the couch and fell to the floor.  Pt does not recall the details of the event.  His wife states that she heard a grunt from the kitchen followed by a crash.  She immediately went to the kitchen and found him face down unconscious.  He was only unconscious for a few seconds.  She did not see any seizure like activity.  He was not post ictal.  Since that time, he has had a severe frontal, pounding headache with associated photophobia and nausea/vomiting.    Patient is a 28 y.o. male presenting with fall.  Fall This is a new problem. Episode onset: a few hours ago. The problem has been resolved. Associated symptoms comments: Headache, right shoulder pain, right knee pain.    Past Medical History  Diagnosis Date  . Bulging disc    History reviewed. No pertinent past surgical history. No family history on file. History  Substance Use Topics  . Smoking status: Current Every Day Smoker  . Smokeless tobacco: Not on file  . Alcohol Use: No    Review of Systems  All other systems reviewed and are negative.     Allergies  Review of patient's allergies indicates no known allergies.  Home Medications   Prior to Admission medications   Medication Sig Start Date End Date Taking? Authorizing Provider  acetaminophen (TYLENOL) 325 MG tablet Take 325-650 mg by mouth every 6 (six) hours as needed for mild pain, moderate pain, fever or headache.    Historical Provider, MD  cyclobenzaprine (FLEXERIL) 10 MG tablet Take 1 tablet (10 mg total) by mouth 2 (two) times daily as needed for muscle spasms. 03/05/14   Jennifer L Piepenbrink, PA-C  HYDROcodone-acetaminophen (NORCO) 10-325  MG per tablet Take 1 tablet by mouth every 6 (six) hours as needed (pain).    Historical Provider, MD  HYDROcodone-acetaminophen (NORCO/VICODIN) 5-325 MG per tablet Take 1-2 tablets by mouth every 6 (six) hours as needed for severe pain. 03/05/14   Jennifer L Piepenbrink, PA-C  HYDROcodone-acetaminophen (NORCO/VICODIN) 5-325 MG per tablet Take 1-2 tablets by mouth every 4 (four) hours as needed for moderate pain or severe pain. 03/11/14   Harle Battiest, NP  ibuprofen (ADVIL,MOTRIN) 200 MG tablet Take 200-400 mg by mouth every 6 (six) hours as needed for fever, headache, mild pain, moderate pain or cramping.    Historical Provider, MD   BP 115/84  Pulse 84  Temp(Src) 98.2 F (36.8 C) (Oral)  Resp 16  Ht 6\' 3"  (1.905 m)  Wt 180 lb (81.647 kg)  BMI 22.50 kg/m2  SpO2 96% Physical Exam  Nursing note and vitals reviewed. Constitutional: He is oriented to person, place, and time. He appears well-developed and well-nourished. No distress.  HENT:  Head: Normocephalic and atraumatic. Head is without raccoon's eyes and without Battle's sign.    Nose: Nose normal.  Eyes: Conjunctivae and EOM are normal. Pupils are equal, round, and reactive to light. No scleral icterus.  Neck: Normal range of motion. No spinous process tenderness and no muscular tenderness present.  Cardiovascular: Normal rate, regular rhythm, normal heart sounds and intact distal pulses.   No murmur heard. Pulmonary/Chest: Effort normal and breath sounds  normal. He has no rales. He exhibits no tenderness.  Abdominal: Soft. There is no tenderness. There is no rebound and no guarding.  Musculoskeletal: Normal range of motion. He exhibits no edema.       Right elbow: He exhibits normal range of motion, no swelling, no effusion, no deformity and no laceration. Tenderness found.       Right knee: He exhibits normal range of motion, no swelling, no effusion, no ecchymosis and no deformity. Tenderness found.       Thoracic back: He  exhibits no tenderness and no bony tenderness.       Lumbar back: He exhibits no tenderness and no bony tenderness.  No evidence of trauma to extremities, except as noted.  2+ distal pulses.    Neurological: He is alert and oriented to person, place, and time.  Skin: Skin is warm and dry. No rash noted.  Psychiatric: He has a normal mood and affect.    ED Course  Procedures (including critical care time) Labs Review Labs Reviewed  CBC WITH DIFFERENTIAL - Abnormal; Notable for the following:    WBC 11.4 (*)    All other components within normal limits  BASIC METABOLIC PANEL - Abnormal; Notable for the following:    Glucose, Bld 104 (*)    All other components within normal limits    Imaging Review Ct Head Wo Contrast  03/16/2014   CLINICAL DATA:  Initial evaluation for fall  EXAM: CT HEAD WITHOUT CONTRAST  TECHNIQUE: Contiguous axial images were obtained from the base of the skull through the vertex without intravenous contrast.  COMPARISON:  Prior CT from 12/12/2013  FINDINGS: There is no acute intracranial hemorrhage or infarct. No mass lesion or midline shift. Gray-Driggers matter differentiation is well maintained. Ventricles are normal in size without evidence of hydrocephalus. CSF containing spaces are within normal limits. No extra-axial fluid collection.  The calvarium is intact.  Orbital soft tissues are within normal limits.  The paranasal sinuses and mastoid air cells are well pneumatized and free of fluid.  Scalp soft tissues are unremarkable.  IMPRESSION: No acute intracranial process.   Electronically Signed   By: Rise MuBenjamin  McClintock M.D.   On: 03/16/2014 03:45  All radiology studies independently viewed by me.      EKG Interpretation   Date/Time:  Thursday March 16 2014 01:23:55 EDT Ventricular Rate:  111 PR Interval:  134 QRS Duration: 86 QT Interval:  318 QTC Calculation: 432 R Axis:   76 Text Interpretation:  Sinus tachycardia Otherwise normal ECG No  significant  change was found Confirmed by [facility]  MD, TREY ([id number]) on  03/16/2014 2:20:56 AM      MDM   Final diagnoses:  Fall, initial encounter  Closed head injury, initial encounter  Concussion, with loss of consciousness of 30 minutes or less, initial encounter    [age] year old male presenting after a fall and loss of consciousness. Details of the are unclear. He now complains of severe headache and nausea. Of note, he was seen in the emergency department several months ago and was diagnosed with orthostatic syncope. It's possible that he stood up, syncopized secondary to orthostasis, and hit his head as he fell. Plan EKG, lab work, CT head, and treatment of his headache.  Workup unremarkable.  Headache better after IV metoclopramide, diphenhydramine, dexamethasone, and fluids. Pt ambulated without difficulty.  Plan DC home.   Warnell Foresterrey Anice Wilshire, MD 03/16/14 [phone]

## 2014-07-01 ENCOUNTER — Emergency Department (HOSPITAL_COMMUNITY)
Admission: EM | Admit: 2014-07-01 | Discharge: 2014-07-01 | Disposition: A | Discharge status: Home / Self Care | Payer: 59 | Attending: Emergency Medicine | Admitting: Emergency Medicine

## 2014-07-01 ENCOUNTER — Emergency Department (HOSPITAL_COMMUNITY): Payer: 59

## 2014-07-01 ENCOUNTER — Encounter (HOSPITAL_COMMUNITY): Payer: Self-pay | Admitting: Emergency Medicine

## 2014-07-01 DIAGNOSIS — Y9389 Activity, other specified: Secondary | ICD-10-CM | POA: Insufficient documentation

## 2014-07-01 DIAGNOSIS — Z8739 Personal history of other diseases of the musculoskeletal system and connective tissue: Secondary | ICD-10-CM | POA: Insufficient documentation

## 2014-07-01 DIAGNOSIS — Y998 Other external cause status: Secondary | ICD-10-CM | POA: Diagnosis not present

## 2014-07-01 DIAGNOSIS — Y9289 Other specified places as the place of occurrence of the external cause: Secondary | ICD-10-CM | POA: Insufficient documentation

## 2014-07-01 DIAGNOSIS — W228XXA Striking against or struck by other objects, initial encounter: Secondary | ICD-10-CM | POA: Diagnosis not present

## 2014-07-01 DIAGNOSIS — R52 Pain, unspecified: Secondary | ICD-10-CM

## 2014-07-01 DIAGNOSIS — T1490XA Injury, unspecified, initial encounter: Secondary | ICD-10-CM

## 2014-07-01 DIAGNOSIS — S99922A Unspecified injury of left foot, initial encounter: Secondary | ICD-10-CM

## 2014-07-01 DIAGNOSIS — Z720 Tobacco use: Secondary | ICD-10-CM | POA: Insufficient documentation

## 2014-07-01 MED ORDER — NAPROXEN 500 MG PO TABS: 500.0000 mg | ORAL_TABLET | Freq: Two times a day (BID) | ORAL | Status: AC

## 2014-07-01 NOTE — ED Provider Notes (Signed)
CSN: [id number]     Arrival date & time 07/01/14  2003 History  This chart was scribed for non-physician practitioner, Oletha Blend, working with Toy Cookey, MD, by Modena Jansky, ED Scribe. This patient was seen in room  and the patient's care was started at 8:11 PM.  Chief Complaint  Patient presents with  . Toe Injury   The history is provided by the patient. No language interpreter was used.   HPI Comments: Dave Wolf is a 29 y.o. male who presents to the Emergency Department complaining of left toe injury that occurred today. He reports that he accidentally hit his left foot against the corner of a wall. He states that she has constant moderate left fifth toe pain with swelling.  No numbness or paresthesias.  Patient is able to ambulate, but with some pain. No intervention dry prior to arrival.  Past Medical History  Diagnosis Date  . Bulging disc    History reviewed. No pertinent past surgical history. No family history on file. History  Substance Use Topics  . Smoking status: Current Every Day Smoker  . Smokeless tobacco: Not on file  . Alcohol Use: No    Review of Systems  Musculoskeletal: Positive for arthralgias.  All other systems reviewed and are negative.   Allergies  Review of patient's allergies indicates no known allergies.  Home Medications   Prior to Admission medications   Medication Sig Start Date End Date Taking? Authorizing Provider  HYDROcodone-acetaminophen (NORCO/VICODIN) 5-325 MG per tablet Take 1-2 tablets by mouth every 4 (four) hours as needed for moderate pain or severe pain. 03/11/14   Harle Battiest, NP   BP 137/85 mmHg  Pulse 110  Temp(Src) 97.9 F (36.6 C) (Oral)  Resp 18  Ht  (1.905 m)  Wt 190 lb (86.183 kg)  BMI 23.75 kg/m2  SpO2 100% Physical Exam  Constitutional: He is oriented to person, place, and time. He appears well-developed and well-nourished. No distress.  HENT:  Head: Normocephalic and atraumatic.   Mouth/Throat: Oropharynx is clear and moist.  Eyes: Conjunctivae and EOM are normal. Pupils are equal, round, and reactive to light.  Neck: Normal range of motion. Neck supple.  Cardiovascular: Normal rate, regular rhythm and normal heart sounds.   Pulmonary/Chest: Effort normal and breath sounds normal. No respiratory distress. He has no wheezes.  Musculoskeletal: Normal range of motion.       Left foot: There is tenderness, bony tenderness and swelling (mild). There is normal range of motion and no deformity.       Feet:  Neurological: He is alert and oriented to person, place, and time.  Skin: Skin is warm and dry. He is not diaphoretic.  Psychiatric: He has a normal mood and affect.  Nursing note and vitals reviewed.   ED Course  Procedures (including critical care time) DIAGNOSTIC STUDIES: Oxygen Saturation is 100% on RA, normal by my interpretation.    COORDINATION OF CARE: 8:12 PM- Pt advised of plan for treatment and pt agrees.  Labs Review Labs Reviewed - No data to display  Imaging Review Dg Toe 5th Left  07/01/2014   CLINICAL DATA:  Pt states that he kicked the wall this evening, left 5th digit pains radiating into foot, best possible lateral  EXAM: DG TOE 5TH LEFT  COMPARISON:  None.  FINDINGS: No fracture. Joints normally spaced and aligned. 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 Soft tissues are unremarkable.  IMPRESSION: No fracture  or dislocation.   Electronically Signed   By: Amie Portlandavid  Ormond M.D.   On: 07/01/2014 20:51     EKG Interpretation None      MDM   Final diagnoses:  Toe injury, left, initial encounter   [age] y.o. M with left 5th toe injury.  Imaging negative for acute fx.  Patient given ace wrap for comfort, d/c home with supportive care.  Rx naprosyn.  Discussed plan with patient, he/she acknowledged understanding and agreed with plan of care.  Return precautions given for new or worsening symptoms.  I personally performed  the services described in this documentation, which was scribed in my presence. The recorded information has been reviewed and is accurate.  Garlon HatchetLisa M Kamla Skilton, PA-C 07/01/14 2104  Toy CookeyMegan Docherty, MD 07/01/14 2125

## 2014-07-01 NOTE — ED Notes (Signed)
Pt. accidentally hit his left foot against the corner of a wall this evening , presents with left 5th toe pain/swelling .

## 2014-07-01 NOTE — Discharge Instructions (Signed)
Take the prescribed medication as directed. May keep food wrapped for comfort. Return to the ED for new or worsening symptoms.

## 2014-10-24 ENCOUNTER — Emergency Department (HOSPITAL_COMMUNITY)
Admission: EM | Admit: 2014-10-24 | Discharge: 2014-10-24 | Disposition: A | Discharge status: Home / Self Care | Payer: 59 | Attending: Emergency Medicine | Admitting: Emergency Medicine

## 2014-10-24 ENCOUNTER — Encounter (HOSPITAL_COMMUNITY): Payer: Self-pay | Admitting: Emergency Medicine

## 2014-10-24 DIAGNOSIS — Z720 Tobacco use: Secondary | ICD-10-CM | POA: Diagnosis not present

## 2014-10-24 DIAGNOSIS — J029 Acute pharyngitis, unspecified: Secondary | ICD-10-CM | POA: Insufficient documentation

## 2014-10-24 DIAGNOSIS — Z791 Long term (current) use of non-steroidal anti-inflammatories (NSAID): Secondary | ICD-10-CM | POA: Diagnosis not present

## 2014-10-24 DIAGNOSIS — Z8739 Personal history of other diseases of the musculoskeletal system and connective tissue: Secondary | ICD-10-CM | POA: Diagnosis not present

## 2014-10-24 MED ORDER — PENICILLIN G BENZATHINE 1200000 UNIT/2ML IM SUSP
1.2000 10*6.[IU] | Freq: Once | INTRAMUSCULAR | Status: AC
  Administered 2014-10-24: 1.2 10*6.[IU] via INTRAMUSCULAR
  Filled 2014-10-24: qty 2

## 2014-10-24 NOTE — ED Notes (Signed)
Pt. reports sore throat with swelling and occasional dry cough onset yesterday , denies fever or chills. Airway intact / respirations unlabored.

## 2014-10-24 NOTE — Discharge Instructions (Signed)
You have been treated for strep throat.  You should start to notice improvement in the next 24-48 hours. Recommend supportive care at home-- cold popsicles, warm soup, etc. To help ease sore throat.  May also use chloraseptic spray as needed and/or salt water gargles. Return here as needed for any new or worsening symptoms.

## 2014-10-24 NOTE — ED Provider Notes (Signed)
CSN: [id number]     Arrival date & time 10/24/14  0015 History   First MD Initiated Contact with Patient 10/24/14 [phone]     Chief Complaint  Patient presents with  . Sore Throat     (Consider location/radiation/quality/duration/timing/severity/associated sxs/prior Treatment) Patient is a 29 y.o. male presenting with pharyngitis. The history is provided by the patient and medical records.  Sore Throat Associated symptoms include a sore throat.    This is a 29 year old male with past medical history significant for herniated disks, presenting to the ED for sore throat, onset this morning upon waking. He states throughout the day symptoms have progressively worsened. He states is painful to swallow but he has been able to eat and drink liquids without difficulty. He states this afternoon his wife looked in the back with throat and noticed "Paulo dots". He denies any fever or chills. No known sick contacts.  Past Medical History  Diagnosis Date  . Bulging disc    Past Surgical History  Procedure Laterality Date  . Appendectomy     No family history on file. History  Substance Use Topics  . Smoking status: Current Every Day Smoker  . Smokeless tobacco: Not on file  . Alcohol Use: No    Review of Systems  HENT: Positive for sore throat.   All other systems reviewed and are negative.     Allergies  Review of patient's allergies indicates no known allergies.  Home Medications   Prior to Admission medications   Medication Sig Start Date End Date Taking? Authorizing Provider  HYDROcodone-acetaminophen (NORCO/VICODIN) 5-325 MG per tablet Take 1-2 tablets by mouth every 4 (four) hours as needed for moderate pain or severe pain. 03/11/14   Harle BattiestElizabeth Tysinger, NP  naproxen (NAPROSYN) 500 MG tablet Take 1 tablet (500 mg total) by mouth 2 (two) times daily with a meal. 07/01/14   Garlon HatchetLisa M Sanders, PA-C   BP 144/87 mmHg  Pulse 91  Temp(Src) 98.3 F (36.8 C) (Oral)  Resp 16  Wt 199 lb  (90.266 kg)  SpO2 96%   Physical Exam  Constitutional: He is oriented to person, place, and time. He appears well-developed and well-nourished. No distress.  HENT:  Head: Normocephalic and atraumatic.  Right Ear: Tympanic membrane and ear canal normal.  Left Ear: Tympanic membrane and ear canal normal.  Nose: Nose normal.  Mouth/Throat: Uvula is midline and mucous membranes are normal. Oropharyngeal exudate and posterior oropharyngeal erythema present. No posterior oropharyngeal edema or tonsillar abscesses.  Tonsils minimally enlarged bilaterally with exudates; uvula midline without evidence of peritonsillar abscess; handling secretions appropriately; no difficulty swallowing or speaking; normal phonation, no stridor  Eyes: Conjunctivae and EOM are normal. Pupils are equal, round, and reactive to light.  Neck: Normal range of motion. Neck supple.  Cardiovascular: Normal rate, regular rhythm and normal heart sounds.   Pulmonary/Chest: Effort normal and breath sounds normal. No respiratory distress. He has no wheezes.  Musculoskeletal: Normal range of motion. He exhibits no edema.  Neurological: He is alert and oriented to person, place, and time.  Skin: Skin is warm and dry. He is not diaphoretic.  Psychiatric: He has a normal mood and affect.  Nursing note and vitals reviewed.   ED Course  Procedures (including critical care time) Labs Review Labs Reviewed - No data to display  Imaging Review No results found.   EKG Interpretation None      MDM   Final diagnoses:  Sore throat   29 year old male with sore  throat, onset this morning upon waking.  He denies known sick contacts, no fever or chills. Patient is afebrile and nontoxic in appearance. His tonsils are minimally enlarged with exudates present.  His uvula remains midline and there is no evidence of peritonsillar abscess. He is handling secretions well and tolerating PO fluids.  Patient treated empirically with bicillin  in the ED, will d/c home with supportive care.  Discussed plan with patient, he/she acknowledged understanding and agreed with plan of care.  Return precautions given for new or worsening symptoms.  Garlon HatchetLisa M Sanders, PA-C 10/24/14 0103  Dione Boozeavid Glick, MD 10/24/14 [phone]

## 2014-10-25 ENCOUNTER — Emergency Department (HOSPITAL_COMMUNITY)
Admission: EM | Admit: 2014-10-25 | Discharge: 2014-10-25 | Disposition: A | Discharge status: Home / Self Care | Payer: 59 | Attending: Emergency Medicine | Admitting: Emergency Medicine

## 2014-10-25 ENCOUNTER — Encounter (HOSPITAL_COMMUNITY): Payer: Self-pay | Admitting: Emergency Medicine

## 2014-10-25 DIAGNOSIS — Z791 Long term (current) use of non-steroidal anti-inflammatories (NSAID): Secondary | ICD-10-CM | POA: Insufficient documentation

## 2014-10-25 DIAGNOSIS — J039 Acute tonsillitis, unspecified: Secondary | ICD-10-CM | POA: Insufficient documentation

## 2014-10-25 DIAGNOSIS — Z8739 Personal history of other diseases of the musculoskeletal system and connective tissue: Secondary | ICD-10-CM | POA: Insufficient documentation

## 2014-10-25 DIAGNOSIS — J029 Acute pharyngitis, unspecified: Secondary | ICD-10-CM | POA: Diagnosis present

## 2014-10-25 DIAGNOSIS — Z720 Tobacco use: Secondary | ICD-10-CM | POA: Diagnosis not present

## 2014-10-25 MED ORDER — HYDROCODONE-ACETAMINOPHEN 7.5-325 MG/15ML PO SOLN: 10.0000 mL | Freq: Four times a day (QID) | ORAL | Status: DC | PRN

## 2014-10-25 MED ORDER — LIDOCAINE VISCOUS 2 % MT SOLN
15.0000 mL | Freq: Once | OROMUCOSAL | Status: AC
  Administered 2014-10-25: 15 mL via OROMUCOSAL
  Filled 2014-10-25: qty 15

## 2014-10-25 MED ORDER — DEXAMETHASONE SODIUM PHOSPHATE 10 MG/ML IJ SOLN
10.0000 mg | Freq: Once | INTRAMUSCULAR | Status: AC
  Administered 2014-10-25: 10 mg via INTRAMUSCULAR
  Filled 2014-10-25: qty 1

## 2014-10-25 MED ORDER — MAGIC MOUTHWASH W/LIDOCAINE: 5.0000 mL | Freq: Four times a day (QID) | ORAL | Status: AC | PRN

## 2014-10-25 NOTE — ED Notes (Signed)
Pt sts sore throat; seen here earlier this week and given PCN injection; pt sts still having pain; Grunwald patches noted to back of throat

## 2014-10-25 NOTE — Discharge Instructions (Signed)
Tonsillitis °Tonsillitis is an infection of the throat that causes the tonsils to become red, tender, and swollen. Tonsils are collections of lymphoid tissue at the back of the throat. Each tonsil has crevices (crypts). Tonsils help fight nose and throat infections and keep infection from spreading to other parts of the body for the first 18 months of life.  °CAUSES °Sudden (acute) tonsillitis is usually caused by infection with streptococcal bacteria. Long-lasting (chronic) tonsillitis occurs when the crypts of the tonsils become filled with pieces of food and bacteria, which makes it easy for the tonsils to become repeatedly infected. °SYMPTOMS  °Symptoms of tonsillitis include: °· A sore throat, with possible difficulty swallowing. °· Menken patches on the tonsils. °· Fever. °· Tiredness. °· New episodes of snoring during sleep, when you did not snore before. °· Small, foul-smelling, yellowish-Hardebeck pieces of material (tonsilloliths) that you occasionally cough up or spit out. The tonsilloliths can also cause you to have bad breath. °DIAGNOSIS °Tonsillitis can be diagnosed through a physical exam. Diagnosis can be confirmed with the results of lab tests, including a throat culture. °TREATMENT  °The goals of tonsillitis treatment include the reduction of the severity and duration of symptoms and prevention of associated conditions. Symptoms of tonsillitis can be improved with the use of steroids to reduce the swelling. Tonsillitis caused by bacteria can be treated with antibiotic medicines. Usually, treatment with antibiotic medicines is started before the cause of the tonsillitis is known. However, if it is determined that the cause is not bacterial, antibiotic medicines will not treat the tonsillitis. If attacks of tonsillitis are severe and frequent, your health care provider may recommend surgery to remove the tonsils (tonsillectomy). °HOME CARE INSTRUCTIONS  °· Rest as much as possible and get plenty of  sleep. °· Drink plenty of fluids. While the throat is very sore, eat soft foods or liquids, such as sherbet, soups, or instant breakfast drinks. °· Eat frozen ice pops. °· Gargle with a warm or cold liquid to help soothe the throat. Mix 1/4 teaspoon of salt and 1/4 teaspoon of baking soda in 8 oz of water. °SEEK MEDICAL CARE IF:  °· Large, tender lumps develop in your neck. °· A rash develops. °· A green, yellow-brown, or bloody substance is coughed up. °· You are unable to swallow liquids or food for 24 hours. °· You notice that only one of the tonsils is swollen. °SEEK IMMEDIATE MEDICAL CARE IF:  °· You develop any new symptoms such as vomiting, severe headache, stiff neck, chest pain, or trouble breathing or swallowing. °· You have severe throat pain along with drooling or voice changes. °· You have severe pain, unrelieved with recommended medications. °· You are unable to fully open the mouth. °· You develop redness, swelling, or severe pain anywhere in the neck. °· You have a fever. °MAKE SURE YOU:  °· Understand these instructions. °· Will watch your condition. °· Will get help right away if you are not doing well or get worse. °Document Released: 03/05/2005 Document Revised: 10/10/2013 Document Reviewed: 11/12/2012 °[employer]® Patient Information ©2015 [employer]. This information is not intended to replace advice given to you by your health care provider. Make sure you discuss any questions you have with your health care provider. ° °

## 2014-10-25 NOTE — ED Provider Notes (Signed)
CSN: [id number]     Arrival date & time 10/25/14  1301 History  This chart was scribed for Dorthula Matasiffany G Sabreena Vogan, PA-C, working with Blake DivineJohn Wofford, MD by Elon SpannerGarrett Cook, ED Scribe. This patient was seen in room TR09C/TR09C and the patient's care was started at 1:13 PM.  Chief Complaint  Patient presents with  . Sore Throat   The history is provided by the patient. No language interpreter was used.   HPI Comments: Dave Wolf is a 29 y.o. male who presents to the Emergency Department complaining of a sore throat onset as the patient woke two days ago.  Patient was seen in the ED yesterday for the same complaint where he received a penicillin injection.  He reports increased pain in the throat since he was discharged and difficulty swallowing due to pain.  He has additionally taken ibuprofen without relief.  He denies fever, drooling, lateralization of pain or swelling.   Past Medical History  Diagnosis Date  . Bulging disc    Past Surgical History  Procedure Laterality Date  . Appendectomy     History reviewed. No pertinent family history. History  Substance Use Topics  . Smoking status: Current Every Day Smoker  . Smokeless tobacco: Not on file  . Alcohol Use: No    Review of Systems  HENT: Positive for sore throat.   All other systems reviewed and are negative.     Allergies  Review of patient's allergies indicates no known allergies.  Home Medications   Prior to Admission medications   Medication Sig Start Date End Date Taking? Authorizing Provider  Alum & Mag Hydroxide-Simeth (MAGIC MOUTHWASH W/LIDOCAINE) SOLN Take 5 mLs by mouth 4 (four) times daily as needed for mouth pain. 10/25/14   Evlyn Amason Neva SeatGreene, PA-C  HYDROcodone-acetaminophen (HYCET) 7.5-325 mg/15 ml solution Take 10 mLs by mouth 4 (four) times daily as needed for moderate pain. 10/25/14   Marlon Peliffany Makailey Hodgkin, PA-C  HYDROcodone-acetaminophen (NORCO/VICODIN) 5-325 MG per tablet Take 1-2 tablets by mouth every 4 (four) hours as  needed for moderate pain or severe pain. 03/11/14   Harle BattiestElizabeth Tysinger, NP  naproxen (NAPROSYN) 500 MG tablet Take 1 tablet (500 mg total) by mouth 2 (two) times daily with a meal. 07/01/14   Garlon HatchetLisa M Sanders, PA-C   BP 126/73 mmHg  Pulse 87  Temp(Src) 97.5 F (36.4 C) (Oral)  Resp 18  SpO2 100% Physical Exam  Constitutional: He is oriented to person, place, and time. He appears well-developed and well-nourished. No distress.  HENT:  Head: Normocephalic and atraumatic.  Right Ear: Tympanic membrane, external ear and ear canal normal.  Left Ear: Tympanic membrane, external ear and ear canal normal.  Nose: Nose normal. No rhinorrhea. Right sinus exhibits no maxillary sinus tenderness and no frontal sinus tenderness. Left sinus exhibits no maxillary sinus tenderness and no frontal sinus tenderness.  Mouth/Throat: Uvula is midline and mucous membranes are normal. No trismus in the jaw. Normal dentition. No dental abscesses or uvula swelling. Oropharyngeal exudate (bilaterally including ) and posterior oropharyngeal edema present. No posterior oropharyngeal erythema or tonsillar abscesses.  No submental edema, tongue not elevated, no trismus. No impending airway obstruction; Pt able to speak full sentences, swallow intact, no drooling, stridor, or tonsillar/uvula displacement. No palatal petechia  Eyes: Conjunctivae and EOM are normal.  Neck: Trachea normal, normal range of motion and full passive range of motion without pain. Neck supple. No spinous process tenderness and no muscular tenderness present. No rigidity. No tracheal deviation and normal range  of motion present. No Brudzinski's sign noted.  Flexion and extension of neck without pain or difficulty. Able to breath without difficulty in extension.  Cardiovascular: Normal rate and regular rhythm.   Pulmonary/Chest: Effort normal and breath sounds normal. No stridor. No respiratory distress. He has no wheezes.  Abdominal: Soft. There is no  tenderness.  No obvious evidence of splenomegaly. Non ttp.   Musculoskeletal: Normal range of motion.  Lymphadenopathy:       Head (right side): No preauricular and no posterior auricular adenopathy present.       Head (left side): No preauricular and no posterior auricular adenopathy present.    He has cervical adenopathy.  Neurological: He is alert and oriented to person, place, and time.  Skin: Skin is warm and dry. No rash noted. He is not diaphoretic.  Psychiatric: He has a normal mood and affect. His behavior is normal.  Nursing note and vitals reviewed.   ED Course  Procedures (including critical care time)  DIAGNOSTIC STUDIES: Oxygen Saturation is 100% on RA, normal by my interpretation.    COORDINATION OF CARE:  1:16 PM Will order topical numbing medication, magic mouthwash, and anti-inflammatory.  Discussed return precautions.  Patient acknowledges and agrees with plan.    Labs Review Labs Reviewed - No data to display  Imaging Review No results found.   EKG Interpretation None      MDM   Final diagnoses:  Tonsillitis    Medications  dexamethasone (DECADRON) injection 10 mg (10 mg Intramuscular Given 10/25/14 1314)  lidocaine (XYLOCAINE) 2 % viscous mouth solution 15 mL (15 mLs Mouth/Throat Given 10/25/14 1314)   Patient reported significant relief of pain with lido wash. No trismus, neck pain, drooling, no increased effort. Rx: hycet and magic mouthwash with lido.  28 y.o.Dave Wolf's evaluation in the Emergency Department is complete. It has been determined that no acute conditions requiring further emergency intervention are present at this time. The patient/guardian have been advised of the diagnosis and plan. We have discussed signs and symptoms that warrant return to the ED, such as changes or worsening in symptoms.  Vital signs are stable at discharge. Filed Vitals:   10/25/14 1307  BP: 126/73  Pulse: 87  Temp: 97.5 F (36.4 C)  Resp: 18     Patient/guardian has voiced understanding and agreed to follow-up with the PCP or specialist.  I personally performed the services described in this documentation, which was scribed in my presence. The recorded information has been reviewed and is accurate.    Marlon Peliffany Tashiba Timoney, PA-C 10/25/14 1342  Blake DivineJohn Wofford, MD 10/26/14 2212

## 2015-03-07 ENCOUNTER — Emergency Department (HOSPITAL_COMMUNITY)
Admission: EM | Admit: 2015-03-07 | Discharge: 2015-03-07 | Payer: Self-pay | Attending: Emergency Medicine | Admitting: Emergency Medicine

## 2015-03-07 ENCOUNTER — Encounter (HOSPITAL_COMMUNITY): Payer: Self-pay | Admitting: Emergency Medicine

## 2015-03-07 ENCOUNTER — Inpatient Hospital Stay
Admission: EM | Admit: 2015-03-07 | Discharge: 2015-03-14 | DRG: 885 | Disposition: A | Discharge status: Home / Self Care | Payer: 59 | Source: Other Acute Inpatient Hospital | Attending: Psychiatry | Admitting: Psychiatry

## 2015-03-07 ENCOUNTER — Encounter: Payer: Self-pay | Admitting: Psychiatry

## 2015-03-07 DIAGNOSIS — Z8739 Personal history of other diseases of the musculoskeletal system and connective tissue: Secondary | ICD-10-CM | POA: Insufficient documentation

## 2015-03-07 DIAGNOSIS — F1210 Cannabis abuse, uncomplicated: Secondary | ICD-10-CM | POA: Insufficient documentation

## 2015-03-07 DIAGNOSIS — G8929 Other chronic pain: Secondary | ICD-10-CM | POA: Diagnosis present

## 2015-03-07 DIAGNOSIS — M545 Low back pain: Secondary | ICD-10-CM | POA: Diagnosis present

## 2015-03-07 DIAGNOSIS — R45851 Suicidal ideations: Secondary | ICD-10-CM | POA: Diagnosis present

## 2015-03-07 DIAGNOSIS — F329 Major depressive disorder, single episode, unspecified: Secondary | ICD-10-CM | POA: Diagnosis present

## 2015-03-07 DIAGNOSIS — F17200 Nicotine dependence, unspecified, uncomplicated: Secondary | ICD-10-CM | POA: Diagnosis present

## 2015-03-07 DIAGNOSIS — F322 Major depressive disorder, single episode, severe without psychotic features: Secondary | ICD-10-CM

## 2015-03-07 DIAGNOSIS — Z720 Tobacco use: Secondary | ICD-10-CM | POA: Insufficient documentation

## 2015-03-07 DIAGNOSIS — Z9889 Other specified postprocedural states: Secondary | ICD-10-CM

## 2015-03-07 DIAGNOSIS — Z590 Homelessness: Secondary | ICD-10-CM

## 2015-03-07 DIAGNOSIS — F323 Major depressive disorder, single episode, severe with psychotic features: Principal | ICD-10-CM | POA: Diagnosis present

## 2015-03-07 DIAGNOSIS — F1120 Opioid dependence, uncomplicated: Secondary | ICD-10-CM | POA: Diagnosis present

## 2015-03-07 DIAGNOSIS — M549 Dorsalgia, unspecified: Secondary | ICD-10-CM

## 2015-03-07 DIAGNOSIS — Z9049 Acquired absence of other specified parts of digestive tract: Secondary | ICD-10-CM

## 2015-03-07 DIAGNOSIS — R51 Headache: Secondary | ICD-10-CM | POA: Diagnosis present

## 2015-03-07 DIAGNOSIS — F1220 Cannabis dependence, uncomplicated: Secondary | ICD-10-CM | POA: Diagnosis present

## 2015-03-07 DIAGNOSIS — F17210 Nicotine dependence, cigarettes, uncomplicated: Secondary | ICD-10-CM | POA: Diagnosis present

## 2015-03-07 DIAGNOSIS — Z046 Encounter for general psychiatric examination, requested by authority: Secondary | ICD-10-CM

## 2015-03-07 DIAGNOSIS — G4700 Insomnia, unspecified: Secondary | ICD-10-CM | POA: Diagnosis present

## 2015-03-07 DIAGNOSIS — F410 Panic disorder [episodic paroxysmal anxiety] without agoraphobia: Secondary | ICD-10-CM | POA: Diagnosis present

## 2015-03-07 DIAGNOSIS — F1110 Opioid abuse, uncomplicated: Secondary | ICD-10-CM | POA: Insufficient documentation

## 2015-03-07 DIAGNOSIS — M5136 Other intervertebral disc degeneration, lumbar region: Secondary | ICD-10-CM | POA: Diagnosis present

## 2015-03-07 HISTORY — DX: Other intervertebral disc degeneration, lumbar region: M51.36

## 2015-03-07 HISTORY — DX: Other intervertebral disc degeneration, lumbar region without mention of lumbar back pain or lower extremity pain: M51.369

## 2015-03-07 LAB — COMPREHENSIVE METABOLIC PANEL
ALBUMIN: 4.3 g/dL (ref 3.5–5.0)
ALT: 18 U/L (ref 17–63)
AST: 21 U/L (ref 15–41)
Alkaline Phosphatase: 73 U/L (ref 38–126)
Anion gap: 10 (ref 5–15)
BILIRUBIN TOTAL: 0.8 mg/dL (ref 0.3–1.2)
BUN: 8 mg/dL (ref 6–20)
CALCIUM: 9.5 mg/dL (ref 8.9–10.3)
CO2: 25 mmol/L (ref 22–32)
CREATININE: 0.76 mg/dL (ref 0.61–1.24)
Chloride: 104 mmol/L (ref 101–111)
GFR calc Af Amer: >60 mL/min (ref >60)
GFR calc non Af Amer: >60 mL/min (ref >60)
Glucose, Bld: 103 mg/dL — ABNORMAL HIGH (ref 65–99)
Potassium: 3.9 mmol/L (ref 3.5–5.1)
SODIUM: 139 mmol/L (ref 135–145)
TOTAL PROTEIN: 6.9 g/dL (ref 6.5–8.1)

## 2015-03-07 LAB — RAPID URINE DRUG SCREEN, HOSP PERFORMED
Amphetamines: NOT DETECTED
BARBITURATES: NOT DETECTED
Benzodiazepines: NOT DETECTED
Cocaine: NOT DETECTED
Opiates: POSITIVE — AB
Tetrahydrocannabinol: POSITIVE — AB

## 2015-03-07 LAB — CBC
HEMATOCRIT: 44.8 % (ref 39.0–52.0)
HEMOGLOBIN: 14.8 g/dL (ref 13.0–17.0)
MCH: 30.5 pg (ref 26.0–34.0)
MCHC: 33 g/dL (ref 30.0–36.0)
MCV: 92.2 fL (ref 78.0–100.0)
Platelets: 327 10*3/uL (ref 150–400)
RBC: 4.86 MIL/uL (ref 4.22–5.81)
RDW: 12.9 % (ref 11.5–15.5)
WBC: 12.6 10*3/uL — ABNORMAL HIGH (ref 4.0–10.5)

## 2015-03-07 LAB — SALICYLATE LEVEL: Salicylate Lvl: <4 mg/dL (ref 2.8–30.0)

## 2015-03-07 LAB — ACETAMINOPHEN LEVEL: Acetaminophen (Tylenol), Serum: <10 ug/mL — ABNORMAL LOW (ref 10–30)

## 2015-03-07 LAB — ETHANOL: Alcohol, Ethyl (B): <5 mg/dL (ref <5)

## 2015-03-07 MED ORDER — IBUPROFEN 800 MG PO TABS
800.0000 mg | ORAL_TABLET | Freq: Four times a day (QID) | ORAL | Status: DC | PRN
  Administered 2015-03-07 – 2015-03-14 (×12): 800 mg via ORAL
  Filled 2015-03-07 (×12): qty 1

## 2015-03-07 MED ORDER — CLONIDINE HCL 0.1 MG PO TABS: 0.1000 mg | ORAL_TABLET | Freq: Three times a day (TID) | ORAL | Status: DC | PRN

## 2015-03-07 MED ORDER — ONDANSETRON HCL 4 MG PO TABS: 4.0000 mg | ORAL_TABLET | Freq: Three times a day (TID) | ORAL | Status: DC | PRN

## 2015-03-07 MED ORDER — CYCLOBENZAPRINE HCL 10 MG PO TABS
5.0000 mg | ORAL_TABLET | Freq: Three times a day (TID) | ORAL | Status: DC
Start: 2015-03-07 — End: 2015-03-11
  Administered 2015-03-07 – 2015-03-10 (×10): 5 mg via ORAL
  Filled 2015-03-07: qty 1
  Filled 2015-03-07: qty 2
  Filled 2015-03-07 (×2): qty 1
  Filled 2015-03-07: qty 2
  Filled 2015-03-07 (×4): qty 1

## 2015-03-07 MED ORDER — FLUOXETINE HCL 10 MG PO CAPS
10.0000 mg | ORAL_CAPSULE | Freq: Every day | ORAL | Status: DC
  Administered 2015-03-07: 10 mg via ORAL
  Filled 2015-03-07 (×2): qty 1

## 2015-03-07 MED ORDER — ACETAMINOPHEN 325 MG PO TABS
650.0000 mg | ORAL_TABLET | ORAL | Status: DC | PRN
  Administered 2015-03-07 (×2): 650 mg via ORAL
  Filled 2015-03-07 (×2): qty 2

## 2015-03-07 MED ORDER — NICOTINE 21 MG/24HR TD PT24
21.0000 mg | MEDICATED_PATCH | Freq: Every day | TRANSDERMAL | Status: DC
  Administered 2015-03-08 – 2015-03-14 (×7): 21 mg via TRANSDERMAL
  Filled 2015-03-07 (×8): qty 1

## 2015-03-07 MED ORDER — LIDOCAINE 5 % EX PTCH
1.0000 | MEDICATED_PATCH | CUTANEOUS | Status: DC
  Administered 2015-03-08 – 2015-03-14 (×7): 1 via TRANSDERMAL
  Filled 2015-03-07 (×8): qty 1

## 2015-03-07 MED ORDER — TRAZODONE HCL 100 MG PO TABS: 100.0000 mg | ORAL_TABLET | Freq: Every evening | ORAL | Status: DC | PRN

## 2015-03-07 MED ORDER — CLONIDINE HCL 0.1 MG PO TABS
0.1000 mg | ORAL_TABLET | Freq: Four times a day (QID) | ORAL | Status: DC
Start: 2015-03-07 — End: 2015-03-10
  Administered 2015-03-07 – 2015-03-09 (×10): 0.1 mg via ORAL
  Filled 2015-03-07 (×10): qty 1

## 2015-03-07 MED ORDER — ZOLPIDEM TARTRATE 5 MG PO TABS: 5.0000 mg | ORAL_TABLET | Freq: Every evening | ORAL | Status: DC | PRN

## 2015-03-07 MED ORDER — LOPERAMIDE HCL 2 MG PO CAPS: 4.0000 mg | ORAL_CAPSULE | ORAL | Status: DC | PRN

## 2015-03-07 MED ORDER — NICOTINE 21 MG/24HR TD PT24
21.0000 mg | MEDICATED_PATCH | Freq: Every day | TRANSDERMAL | Status: DC
  Administered 2015-03-07: 21 mg via TRANSDERMAL
  Filled 2015-03-07: qty 1

## 2015-03-07 MED ORDER — QUETIAPINE FUMARATE 100 MG PO TABS
100.0000 mg | ORAL_TABLET | Freq: Every day | ORAL | Status: DC
Start: 2015-03-07 — End: 2015-03-09
  Administered 2015-03-07 – 2015-03-08 (×2): 100 mg via ORAL
  Filled 2015-03-07 (×2): qty 1

## 2015-03-07 NOTE — Progress Notes (Signed)
Pt spent most evening in his room, depressed mood, complained of chronic back pain of 7/10; medicated with PRN (see MAR). Pt denies any SI/HI/VAH, complaint with his medication, no further concerns voiced, will continue to monitor for safety.

## 2015-03-07 NOTE — BH Assessment (Signed)
[facility] Assessment Progress Note  This Clinical research associate discussed consent to release information with pt, and he has opted to decline consent to anyone.  IVC documents have been faxed to [facility] to [phone].  Doylene Canning, MA Triage Specialist [phone]

## 2015-03-07 NOTE — Plan of Care (Signed)
Problem: Ineffective individual coping Goal: STG: Pt will be able to identify effective and ineffective STG: Pt will be able to identify effective and ineffective coping patterns Outcome: Not Progressing Continues to voice of suicidal ideations

## 2015-03-07 NOTE — ED Notes (Signed)
Bed: [facility] Expected date:  Expected time:  Means of arrival:  Comments: IVC en route

## 2015-03-07 NOTE — Plan of Care (Signed)
Problem: Consults Goal: [facility] General Treatment Patient Education Outcome: Progressing Pt is progressing towards achieving this goal.     

## 2015-03-07 NOTE — ED Notes (Signed)
Pt presents with depression, SI thoughts, sitting on railroad tracks tonight,  Pt wrote SI letter and cards to family members.  Pt tearful during assessment.  Denies HI or AV hallucinations, feeling hopeless.  Pt admits to marijuana use.  AAO x 3, no acute distress noted, calm & cooperative.  Monitoring for safety, Q 15 min checks in effect.

## 2015-03-07 NOTE — ED Notes (Signed)
TTS Harriett Sine at bedside to eval pt prior to RN eval.

## 2015-03-07 NOTE — ED Notes (Addendum)
PT HAS SHIRT, PANTS, SOCKS, SHOES, CAP, WALLET WITH [employer] DEBIT CARD, PLASMA CARD, LIGHTER, [place] DRIVER LICENSE, VA CARD AND PICTURES.  PT SEEN  AND WANDED BY SECURITY.

## 2015-03-07 NOTE — BH Assessment (Signed)
Reviewed ED notes prior to initiating assessment. Pt reports "going through a lot" thinking about suicide and sitting next to train for hours.   Requested cart be placed for assessment, and IVC papers be faxed to [id number].   Assessment to commence shortly.   Clista Bernhardt, [facility] Triage Specialist 03/07/2015 5:28 AM

## 2015-03-07 NOTE — BH Assessment (Addendum)
[facility] Assessment Progress Note  Per Thedore Mins, MD, this pt requires psychiatric hospitalization at this time.  He presents under IVC initiated by Patent examiner.  Dr Jannifer Franklin has upheld the petition.  Given that pt reportedly receives VA benefits, this Clinical research associate called the [facility] several times to ask about referral.  Calls rolled to voice mail.  At 11:39 April called back to request additional information, but indicated that they do not currently have any male beds available.  Fatima at [facility] then called.  Pt has been accepted to their facility by Dr Jennet Maduro.  Margaretmary Lombard will call back with room assignment and with phone number for report.  Dahlia Byes, NP concurs with this decision.  Doylene Canning, MA Triage Specialist [phone]   Addendum:  Margaretmary Lombard calls to report that pt will be admitted to Rm 306 after 15:00.  Please call report to [phone].  Pt's nurse, Jan, has been notified.  Doylene Canning, MA Triage Specialist [phone]

## 2015-03-07 NOTE — Plan of Care (Signed)
Problem: Ineffective individual coping Goal: STG: Patient will remain free from self harm Outcome: Progressing Able to verbalize understanding of safety

## 2015-03-07 NOTE — BH Assessment (Addendum)
Tele Assessment Note   Dave Wolf is an 29 y.o. male. Brought in under IVC petitioned by the [facility] police. Pt reports two months ago his wife of nine years, came home and told him he needed to leave. He reports he has no family here and no where to go. He has been staying "here and there" and been basically homeless. He reports he has been suicidal for almost two months, and reports even when he can fall asleep he dreams about suicide.  Tonight pt was sitting by the train tracks for 3-4 hours contemplating stepping in front of a train. He reports he was seconds away from standing on the tracks when it began to rain. He went under tracks and a train came by, then the rain stopped. He took this as a sign and reached out to officer he had spoke with earlier and was brought to ED. 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 Pt reports without his kids he does not want to go on living. Pt also reports another risk factor, chronic back pain. He reports he became addicted to oxycodone in 2008 when it was prescribed and has been self medicating since that time, sometimes up to 100 mg per day. He went to detox at [facility] and programming at [facility] but has relapsed. Denies HI, AVH, and self harm. Speech is logical and coherent, is not responding to internal stimuli.   Pt is not eating or sleeping well, has lost 10 pounds, mood is labile. He reports racing thoughts, despair, loss of pleasure, loss of motivation, and feeling completely overwhelmed by his emotions. No hx of mania.   Pt reports hx of very infrequent panic attacks prior to separation. Notes racing thoughts, intense anxiety, and panic attacks for the last two months. No hx of abuse or neglect, no previous military related trauma reported.   Family hx is  positive for alcohol abuse (Father now in 10 years recovery), he reports mother is a drug user but he has had no contact for 20 years. No family hx of MH, or SI noted.   Pt is unable to contract for safety, and despite being under IVC, states he sought help from officers willingly and would have come in voluntarily.   Diagnosis: 296.23 Major Depressive Disorder, severe, without psychotic features, 300 Unspecified Anxiety Disorder, 304.00 Opioid Use Disorder, severe     Past Medical History:  Past Medical History  Diagnosis Date  . Bulging disc   . DDD (degenerative disc disease), lumbar     Past Surgical History  Procedure Laterality Date  . Appendectomy    . Femur traction Right     Family History: No family history on file.  Social History:  reports that he has been smoking Cigarettes.  He has been smoking about 0.75 packs per day. He does not have any smokeless tobacco history on file. He reports that he uses illicit drugs (Marijuana). He reports that he does not drink alcohol.  Additional Social History:  Alcohol / Drug Use Pain Medications: Hx of misusing oxy codone that was originally prescribed to him, last  Prescriptions: denies Over the Counter: see PTA History of alcohol / drug use?: Yes Longest period of sobriety (when/how long): months Negative Consequences of  Use: Personal relationships Withdrawal Symptoms:  (none currently ) Substance #1 Name of Substance 1: THC 1 - Age of First Use: unknown 1 - Amount (size/oz): varies 1 - Frequency: ocassionally  1 - Duration: years 1 - Last Use / Amount: unknown Substance #2 Name of Substance 2: ocycodone 2 - Age of First Use: 2008 when prescribed for back pain and began to misues 2 - Amount (size/oz):  to 100 mg daily  2 - Frequency: "not that often recently" 2 - Duration: 8 years 2 - Last Use / Amount: 2 days ago   CIWA: CIWA-Ar BP: (!) 153/103 mmHg Pulse Rate: 84 COWS:    PATIENT STRENGTHS: (choose at least  two) Ability for insight Communication skills  Allergies:  Allergies  Allergen Reactions  . Bee Venom Other (See Comments)    Cant remember specific reaction    Home Medications:  (Not in a hospital admission)  OB/GYN Status:  No LMP for male patient.  General Assessment Data [facility]: [facility] TTS Assessment: In system Is this a Tele or Face-to-Face Assessment?: Tele Assessment Is this an Initial Assessment or a Re-assessment for this encounter?: Initial Assessment Marital status: Separated Is patient pregnant?: No Pregnancy Status: No Living Arrangements: Other (Comment) (currently homeles b/c wife asked him to leave) Can pt return to current living arrangement?: Yes Admission Status: Involuntary Is patient capable of signing voluntary admission?: No Referral Source: Self/Family/Friend (and police) Insurance type: VA     Crisis Care Plan Living Arrangements: Other (Comment) (currently homeles b/c wife asked him to leave) Name of Psychiatrist: none Name of Therapist: none  Education Status Is patient currently in school?: No Current Grade: NA Highest grade of school patient has completed: some [employer] Name of school: NA Contact person: NA  Risk to self with the past 6 months Suicidal Ideation: Yes-Currently Present Has patient been a risk to self within the past 6 months prior to admission? : Yes Suicidal Intent: Yes-Currently Present Has patient had any suicidal intent within the past 6 months prior to admission? : Yes Is patient at risk for suicide?: Yes Suicidal Plan?: Yes-Currently Present Has patient had any suicidal plan within the past 6 months prior to admission? : Yes Specify Current Suicidal Plan: stepping in front of train, was sitting there for hours waiting on train Access to Means: Yes Specify Access to Suicidal Means: train tracks What has been your use of drugs/alcohol within the last 12 months?: Pt reports self medicating with  oxycodone for 8 years Previous Attempts/Gestures: No How many times?: 0 Other Self Harm Risks: none Triggers for Past Attempts: None known Intentional Self Injurious Behavior: None Family Suicide History: No Recent stressful life event(s): Other (Comment) (separation ) Persecutory voices/beliefs?: No Depression: Yes Depression Symptoms: Despondent, Insomnia, Tearfulness, Isolating, Fatigue, Guilt, Loss of interest in usual pleasures, Feeling worthless/self pity Substance abuse history and/or treatment for substance abuse?: Yes Suicide prevention information given to non-admitted patients: Not applicable  Risk to Others within the past 6 months Homicidal Ideation: No Does patient have any lifetime risk of violence toward others beyond the six months prior to admission? : No Thoughts of Harm to Others: No Current Homicidal Intent: No Current Homicidal Plan: No Access to Homicidal Means: No Identified Victim: none History of harm to others?: No Assessment of Violence: None Noted Violent Behavior Description: none Does patient have access to weapons?: No Criminal Charges Pending?: No Does patient have a court date: No Is patient on probation?: No  Psychosis  Hallucinations: None noted Delusions: None noted  Mental Status Report Appearance/Hygiene: Unremarkable Eye Contact: Good Motor Activity:  (tearful, hiding face under cover at times) Speech: Logical/coherent Level of Consciousness: Alert Mood: Depressed Affect: Appropriate to circumstance Anxiety Level: Moderate Thought Processes: Coherent, Relevant Judgement: Impaired Orientation: Place, Time, Person, Situation Obsessive Compulsive Thoughts/Behaviors: None  Cognitive Functioning Concentration: Normal Memory: Recent Intact, Remote Intact IQ: Average Insight: Good Impulse Control: Fair Appetite: Poor Weight Loss: 10 Weight Gain: 0 Sleep: Decreased Total Hours of Sleep:  (very little) Vegetative Symptoms:  None  ADLScreening [facility] Assessment Services) Patient's cognitive ability adequate to safely complete daily activities?: Yes Patient able to express need for assistance with ADLs?: Yes Independently performs ADLs?: Yes (appropriate for developmental age)  Prior Inpatient Therapy Prior Inpatient Therapy: Yes Prior Therapy Dates: about two years ago [facility] Prior Therapy Facilty/Provider(s): [facility] Reason for Treatment: detox  Prior Outpatient Therapy Prior Outpatient Therapy: Yes Prior Therapy Dates: about a year ago Prior Therapy Facilty/Provider(s): [facility] Reason for Treatment: SA Does patient have an ACCT team?: No Does patient have Intensive In-House Services?  : No Does patient have [facility] services? : No Does patient have P4CC services?: No  ADL Screening (condition at time of admission) Patient's cognitive ability adequate to safely complete daily activities?: Yes Is the patient deaf or have difficulty hearing?: No Does the patient have difficulty seeing, even when wearing glasses/contacts?: No Does the patient have difficulty concentrating, remembering, or making decisions?: No Patient able to express need for assistance with ADLs?: Yes Does the patient have difficulty dressing or bathing?: No Independently performs ADLs?: Yes (appropriate for developmental age) Does the patient have difficulty walking or climbing stairs?: No Weakness of Legs: None Weakness of Arms/Hands: None  Home Assistive Devices/Equipment Home Assistive Devices/Equipment: None    Abuse/Neglect Assessment (Assessment to be complete while patient is alone) Physical Abuse: Denies Verbal Abuse: Denies Sexual Abuse: Denies Exploitation of patient/patient's resources: Denies Self-Neglect: Denies Values / Beliefs Cultural Requests During Hospitalization: None Spiritual Requests During Hospitalization: None   Advance Directives (For Healthcare) Does patient have an advance  directive?: No Would patient like information on creating an advanced directive?: No - patient declined information    Additional Information 1:1 In Past 12 Months?: No CIRT Risk: No Elopement Risk: No Does patient have medical clearance?: No (labs pending )     Disposition:  Per Kellie Shropshire, PA pt meets inpt criteria and can be accepted to [facility] 300 or 400 hall. Per Hassie Bruce, there are currently no appropriate beds. [facility] to be [facility] for [facility] per [facility] if Bed becomes available.    Informed Latricia, RN of recommendations.   Elpidio Anis PA informed of recommendations and is agreement.   Disposition Initial Assessment Completed for this Encounter: Yes Disposition of Patient: Inpatient treatment program  Matraca Hunkins M 03/07/2015 6:10 AM

## 2015-03-07 NOTE — ED Notes (Signed)
Pt is calm in his room with reports of si thoughts. Pt reports that he is separated from his wife and believes that she is seeing someone else. He has two children and says that she will not let him see them. He is currently homeless. Pt denies hallucinations and hi. 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 Pt reports using THC 2-3 times a month and buying opiates (hydrocodone) of the street using twice a week.

## 2015-03-07 NOTE — ED Notes (Signed)
Patient under IVC. Patient reports "going through a lot". Patient states he is having suicidal thoughts. Patient states he has been having these thoughts daily for 2 months. Patient states he has a plan but is unable to elaborate. Patient states he was sitting beside the train tracks tonight for several hours. Patient is tearful, states "this is so embarrassing". "I really just want to go to sleep,  But my body won't turn off".

## 2015-03-07 NOTE — BH Assessment (Signed)
Seeking inpt treatment. Sent referrals to: [place], [place], [place], [place], [place], [place], [place], [place] (will need [place] specific referral forms)    Clista Bernhardt,  [facility] Triage Specialist 03/07/2015 6:28 AM

## 2015-03-07 NOTE — Tx Team (Signed)
Initial Interdisciplinary Treatment Plan   PATIENT STRESSORS: Financial difficulties Legal issue Loss of marriage Substance abuse   PATIENT STRENGTHS: Ability for insight Average or above average intelligence Capable of independent living Financial means   PROBLEM LIST: Problem List/Patient Goals Date to be addressed Date deferred Reason deferred Estimated date of resolution  Depression   03/07/15     Substance Abuse 03/07/15     Smoker 03/07/15                                          DISCHARGE CRITERIA:  Ability to meet basic life and health needs Improved stabilization in mood, thinking, and/or behavior Safe-care adequate arrangements made Withdrawal symptoms are absent or subacute and managed without 24-hour nursing intervention  PRELIMINARY DISCHARGE PLAN: Outpatient therapy Placement in alternative living arrangements  PATIENT/FAMIILY INVOLVEMENT: This treatment plan has been presented to and reviewed with the patient, Dave Wolf, and/or family member,.  The patient and family have been given the opportunity to ask questions and make suggestions.  Gwen A Farrish 03/07/2015, 5:35 PM

## 2015-03-07 NOTE — Consult Note (Signed)
[place] [facility] Consult   Reason for Consult:  Depression, Opioid use disorder, Moderate, Cannabis use disorder, severe Referring Physician: EDP Patient Identification: Dave Wolf MRN:  [id number] Principal Diagnosis: Severe major depression, single episode, without psychotic features Diagnosis:   Patient Active Problem List   Diagnosis Date Noted  . Severe major depression, single episode, without psychotic features [F32.2] 03/07/2015    Priority: High  . Opioid use disorder, moderate, dependence [F11.20] 03/07/2015  . Cannabis use disorder, severe, dependence [F12.20] 03/07/2015    Total Time spent with patient: 1 hour  Subjective:   Dave Wolf is a 29 y.o. male patient admitted with Depression, Opioid use disorder, Moderate, Cannabis use disorder, severe   HPI:  Caucasian male, 29 years old was evaluated this morning for severe depression.  Patient reports marital issue as his stressor and not being able to go to work.  Patient reports that his wife is ending their marriage and asked him to leave their house for the past 2 months.   Patient reports that he became very sad when he found out that his wife has been cheating on him.   He left without his car and is unable to go to work.  Patient admitted to using Marijuaana 2-3 times a  Month.  Patient also use Opiates Hydrocodone once or twice a month.  Patient is a IT trainer  And denies any MH issues and does not see a Psychiatrist or take medication for MH.  He feel shopeless and helpless.  Patient denies previous suicide attempt but is not sure what he will do if he does not get help.  He has been accepted for admission at [facility] and will be transported as soon as transport is here.  Past Psychiatric History: Denies  Risk to Self: Suicidal Ideation: Yes-Currently Present Suicidal Intent: Yes-Currently Present Is patient at risk for suicide?: Yes Suicidal Plan?: Yes-Currently Present Specify  Current Suicidal Plan: stepping in front of train, was sitting there for hours waiting on train Access to Means: Yes Specify Access to Suicidal Means: train tracks What has been your use of drugs/alcohol within the last 12 months?: Pt reports self medicating with oxycodone for 8 years How many times?: 0 Other Self Harm Risks: none Triggers for Past Attempts: None known Intentional Self Injurious Behavior: None Risk to Others: Homicidal Ideation: No Thoughts of Harm to Others: No Current Homicidal Intent: No Current Homicidal Plan: No Access to Homicidal Means: No Identified Victim: none History of harm to others?: No Assessment of Violence: None Noted Violent Behavior Description: none Does patient have access to weapons?: No Criminal Charges Pending?: No Does patient have a court date: No Prior Inpatient Therapy: Prior Inpatient Therapy: Yes Prior Therapy Dates: about two years ago [facility] Prior Therapy Facilty/Provider(s): [facility] Reason for Treatment: detox Prior Outpatient Therapy: Prior Outpatient Therapy: Yes Prior Therapy Dates: about a year ago Prior Therapy Facilty/Provider(s): [facility] Reason for Treatment: SA Does patient have an ACCT team?: No Does patient have Intensive In-House Services?  : No Does patient have [facility] services? : No Does patient have P4CC services?: No  Past Medical History:  Past Medical History  Diagnosis Date  . Bulging disc   . DDD (degenerative disc disease), lumbar     Past Surgical History  Procedure Laterality Date  . Appendectomy    . Femur traction Right    Family History: No family history on file.   Family Psychiatric  History:  Does not know, have no communication with his family. Social History:   History  Alcohol Use No     History  Drug Use  . Yes  . Special: Marijuana    Comment: 2-3x monthly    Social History   Social History  . Marital Status: Married    Spouse Name: N/A  . Number  of Children: N/A  . Years of Education: N/A   Social History Main Topics  . Smoking status: Current Every Day Smoker -- 0.75 packs/day    Types: Cigarettes  . Smokeless tobacco: None  . Alcohol Use: No  . Drug Use: Yes    Special: Marijuana     Comment: 2-3x monthly  . Sexual Activity: Not Asked   Other Topics Concern  . None   Social History Narrative   Additional Social History:    Pain Medications: Hx of misusing oxy codone that was originally prescribed to him, last  Prescriptions: denies Over the Counter: see PTA History of alcohol / drug use?: Yes Longest period of sobriety (when/how long): months Negative Consequences of Use: Personal relationships Withdrawal Symptoms:  (none currently ) Name of Substance 1: THC 1 - Age of First Use: unknown 1 - Amount (size/oz): varies 1 - Frequency: ocassionally  1 - Duration: years 1 - Last Use / Amount: unknown Name of Substance 2: ocycodone 2 - Age of First Use: 2008 when prescribed for back pain and began to misues 2 - Amount (size/oz): 11m to 100 mg daily  2 - Frequency: "not that often recently" 2 - Duration: 8 years 2 - Last Use / Amount: 2 days ago                  Allergies:   Allergies  Allergen Reactions  . Bee Venom Other (See Comments)    Cant remember specific reaction    Labs:  Results for orders placed or performed during the hospital encounter of 03/07/15 (from the past 48 hour(s))  Comprehensive metabolic panel     Status: Abnormal   Collection Time: 03/07/15  5:05 AM  Result Value Ref Range   Sodium 139 135 - 145 mmol/L   Potassium 3.9 3.5 - 5.1 mmol/L   Chloride 104 101 - 111 mmol/L   CO2 25 22 - 32 mmol/L   Glucose, Bld 103 (H) 65 - 99 mg/dL   BUN 8 6 - 20 mg/dL   Creatinine, Ser 0.76 0.61 - 1.24 mg/dL   Calcium 9.5 8.9 - 10.3 mg/dL   Total Protein 6.9 6.5 - 8.1 g/dL   Albumin 4.3 3.5 - 5.0 g/dL   AST 21 15 - 41 U/L   ALT 18 17 - 63 U/L   Alkaline Phosphatase 73 38 - 126 U/L   Total  Bilirubin 0.8 0.3 - 1.2 mg/dL   GFR calc non Af Amer >60 >60 mL/min   GFR calc Af Amer >60 >60 mL/min    Comment: (NOTE) The eGFR has been calculated using the CKD EPI equation. This calculation has not been validated in all clinical situations. eGFR's persistently <60 mL/min signify possible Chronic Kidney Disease.    Anion gap 10 5 - 15  Ethanol (ETOH)     Status: None   Collection Time: 03/07/15  5:05 AM  Result Value Ref Range   Alcohol, Ethyl (B) <5 <5 mg/dL    Comment:        LOWEST DETECTABLE LIMIT FOR SERUM ALCOHOL IS 5 mg/dL FOR MEDICAL PURPOSES ONLY  Salicylate level     Status: None   Collection Time: 03/07/15  5:05 AM  Result Value Ref Range   Salicylate Lvl <7.2 2.8 - 30.0 mg/dL  Acetaminophen level     Status: Abnormal   Collection Time: 03/07/15  5:05 AM  Result Value Ref Range   Acetaminophen (Tylenol), Serum <10 (L) 10 - 30 ug/mL    Comment:        THERAPEUTIC CONCENTRATIONS VARY SIGNIFICANTLY. A RANGE OF 10-30 ug/mL MAY BE AN EFFECTIVE CONCENTRATION FOR MANY PATIENTS. HOWEVER, SOME ARE BEST TREATED AT CONCENTRATIONS OUTSIDE THIS RANGE. ACETAMINOPHEN CONCENTRATIONS >150 ug/mL AT 4 HOURS AFTER INGESTION AND >50 ug/mL AT 12 HOURS AFTER INGESTION ARE OFTEN ASSOCIATED WITH TOXIC REACTIONS.   CBC     Status: Abnormal   Collection Time: 03/07/15  5:05 AM  Result Value Ref Range   WBC 12.6 (H) 4.0 - 10.5 K/uL   RBC 4.86 4.22 - 5.81 MIL/uL   Hemoglobin 14.8 13.0 - 17.0 g/dL   HCT 44.8 39.0 - 52.0 %   MCV 92.2 78.0 - 100.0 fL   MCH 30.5 26.0 - 34.0 pg   MCHC 33.0 30.0 - 36.0 g/dL   RDW 12.9 11.5 - 15.5 %   Platelets 327 150 - 400 K/uL  Urine rapid drug screen (hosp performed) (Not at [facility])     Status: Abnormal   Collection Time: 03/07/15  5:13 AM  Result Value Ref Range   Opiates POSITIVE (A) NONE DETECTED   Cocaine NONE DETECTED NONE DETECTED   Benzodiazepines NONE DETECTED NONE DETECTED   Amphetamines NONE DETECTED NONE DETECTED    Tetrahydrocannabinol POSITIVE (A) NONE DETECTED   Barbiturates NONE DETECTED NONE DETECTED    Comment:        DRUG SCREEN FOR MEDICAL PURPOSES ONLY.  IF CONFIRMATION IS NEEDED FOR ANY PURPOSE, NOTIFY LAB WITHIN 5 DAYS.        LOWEST DETECTABLE LIMITS FOR URINE DRUG SCREEN Drug Class       Cutoff (ng/mL) Amphetamine      1000 Barbiturate      200 Benzodiazepine   536 Tricyclics       644 Opiates          300 Cocaine          300 THC              50     Current Facility-Administered Medications  Medication Dose Route Frequency Provider Last Rate Last Dose  . acetaminophen (TYLENOL) tablet 650 mg  650 mg Oral Q4H PRN Charlann Lange, PA-C   650 mg at 03/07/15 0609  . cloNIDine (CATAPRES) tablet 0.1 mg  0.1 mg Oral Q8H PRN Mojeed Akintayo      . FLUoxetine (PROZAC) capsule 10 mg  10 mg Oral Daily Mojeed Akintayo   10 mg at 03/07/15 1200  . nicotine (NICODERM CQ - dosed in mg/24 hours) patch 21 mg  21 mg Transdermal Daily Shari Upstill, PA-C   21 mg at 03/07/15 1040  . ondansetron (ZOFRAN) tablet 4 mg  4 mg Oral Q8H PRN Charlann Lange, PA-C      . traZODone (DESYREL) tablet 100 mg  100 mg Oral QHS PRN Mojeed Akintayo       Current Outpatient Prescriptions  Medication Sig Dispense Refill  . acetaminophen (TYLENOL) 500 MG tablet Take 1,000 mg by mouth every 6 (six) hours as needed for mild pain.    Marland Kitchen Alum & Mag Hydroxide-Simeth (MAGIC MOUTHWASH W/LIDOCAINE) SOLN Take 5  mLs by mouth 4 (four) times daily as needed for mouth pain. (Patient not taking: Reported on 03/07/2015) 120 mL 0  . HYDROcodone-acetaminophen (HYCET) 7.5-325 mg/15 ml solution Take 10 mLs by mouth 4 (four) times daily as needed for moderate pain. (Patient not taking: Reported on 03/07/2015) 120 mL 0  . HYDROcodone-acetaminophen (NORCO/VICODIN) 5-325 MG per tablet Take 1-2 tablets by mouth every 4 (four) hours as needed for moderate pain or severe pain. (Patient not taking: Reported on 03/07/2015) 6 tablet 0  . naproxen  (NAPROSYN) 500 MG tablet Take 1 tablet (500 mg total) by mouth 2 (two) times daily with a meal. (Patient not taking: Reported on 03/07/2015) 30 tablet 0    Musculoskeletal: Strength & Muscle Tone: within normal limits Gait & Station: normal Patient leans: N/A  Psychiatric Specialty Exam: Review of Systems  Constitutional: Negative.   HENT: Negative.   Eyes: Negative.   Respiratory: Negative.   Cardiovascular: Negative.   Gastrointestinal: Negative.   Genitourinary: Negative.   Musculoskeletal: Negative.   Skin: Negative.   Neurological: Negative.   Endo/Heme/Allergies: Negative.     Blood pressure 105/56, pulse 77, temperature 97.9 F (36.6 C), temperature source Oral, resp. rate 16, height 6' 3"  (1.905 m), weight 86.183 kg (190 lb), SpO2 97 %.Body mass index is 23.75 kg/(m^2).  General Appearance: Casual and Fairly Groomed  Eye Contact::  Good  Speech:  Clear and Coherent and Normal Rate  Volume:  Normal  Mood:  Angry and Depressed  Affect:  Congruent, Depressed and Flat  Thought Process:  Coherent, Goal Directed and Intact  Orientation:  Full (Time, Place, and Person)  Thought Content:  WDL  Suicidal Thoughts:  No  Homicidal Thoughts:  No  Memory:  Immediate;   Good Recent;   Good Remote;   Good  Judgement:  Fair  Insight:  Fair  Psychomotor Activity:  Psychomotor Retardation  Concentration:  Good  Recall:  NA  Fund of Knowledge:Good  Language: Good  Akathisia:  NA  Handed:  Right  AIMS (if indicated):     Assets:  Desire for Improvement  ADL's:  Intact  Cognition: WNL  Sleep:       Disposition: Accepted at [facility]   PMHNP-BC 03/07/2015 1:21 PM Patient seen face-to-face for psychiatric evaluation, chart reviewed and case discussed with the physician extender and developed treatment plan. Reviewed the information documented and agree with the treatment plan. Corena Pilgrim, MD

## 2015-03-07 NOTE — ED Notes (Signed)
PA at bedside.

## 2015-03-07 NOTE — Progress Notes (Signed)
Admission Note:  D: Pt appeared depressed  With  a flat affect.  Patient   Voice of suicidal ideations  No plan at present . Patient was found sitting [place] the side of the rail road tracks  Waiting for the train. Patient recently separated from wife . 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 A: Pt admitted to unit per protocol, skin assessment and search done and no contraband found.  Pt  educated on therapeutic milieu rules. Pt was introduced to milieu by nursing staff.    R: Pt was receptive to education about the milieu .  15 min safety checks started. Clinical research associate offered support

## 2015-03-07 NOTE — ED Provider Notes (Signed)
CSN: [id number]     Arrival date & time 03/07/15  0421 History   First MD Initiated Contact with Patient 03/07/15 0430     Chief Complaint  Patient presents with  . IVC   . Suicidal     (Consider location/radiation/quality/duration/timing/severity/associated sxs/prior Treatment) HPI Comments: Patient with a history of depression, previous suicidal ideation, presents with [employer] under IVC for suicidal ideation. [employer] called by friend who received a letter the patient had left for her to find at her house stating he intended to end his life. IVC was petitioned by police. The patient endorses SI and depression. He reported he waited by the railroad track tonight for several hours waiting for a train.   The history is provided by the patient. No language interpreter was used.    Past Medical History  Diagnosis Date  . Bulging disc   . DDD (degenerative disc disease), lumbar    Past Surgical History  Procedure Laterality Date  . Appendectomy    . Femur traction Right    No family history on file. Social History  Substance Use Topics  . Smoking status: Current Every Day Smoker -- 0.75 packs/day    Types: Cigarettes  . Smokeless tobacco: None  . Alcohol Use: No    Review of Systems  Constitutional: Negative for fever and chills.  HENT: Negative.   Respiratory: Negative.   Cardiovascular: Negative.   Gastrointestinal: Negative.   Musculoskeletal: Negative.   Skin: Negative.   Neurological: Negative.   Psychiatric/Behavioral: Positive for suicidal ideas and dysphoric mood.      Allergies  Bee venom  Home Medications   Prior to Admission medications   Medication Sig Start Date End Date Taking? Authorizing Provider  acetaminophen (TYLENOL) 500 MG tablet Take 1,000 mg by mouth every 6 (six) hours as needed for mild pain.   Yes Historical Provider, MD  Alum & Mag Hydroxide-Simeth (MAGIC MOUTHWASH W/LIDOCAINE) SOLN Take 5 mLs by mouth 4 (four) times daily as needed for mouth  pain. Patient not taking: Reported on 03/07/2015 10/25/14   Marlon Pel, PA-C  HYDROcodone-acetaminophen (HYCET) 7.5-325 mg/15 ml solution Take 10 mLs by mouth 4 (four) times daily as needed for moderate pain. Patient not taking: Reported on 03/07/2015 10/25/14   Marlon Pel, PA-C  HYDROcodone-acetaminophen (NORCO/VICODIN) 5-325 MG per tablet Take 1-2 tablets by mouth every 4 (four) hours as needed for moderate pain or severe pain. Patient not taking: Reported on 03/07/2015 03/11/14   Harle Battiest, NP  naproxen (NAPROSYN) 500 MG tablet Take 1 tablet (500 mg total) by mouth 2 (two) times daily with a meal. Patient not taking: Reported on 03/07/2015 07/01/14   Garlon Hatchet, PA-C   BP 153/103 mmHg  Pulse 84  Temp(Src) 98.3 F (36.8 C) (Oral)  Resp 18  Ht  (1.905 m)  Wt 190 lb (86.183 kg)  BMI 23.75 kg/m2  SpO2 100% Physical Exam  Constitutional: He is oriented to person, place, and time. He appears well-developed and well-nourished.  HENT:  Head: Normocephalic.  Neck: Normal range of motion. Neck supple.  Cardiovascular: Normal rate and regular rhythm.   Pulmonary/Chest: Effort normal and breath sounds normal.  Abdominal: Soft. Bowel sounds are normal. There is no tenderness. There is no rebound and no guarding.  Musculoskeletal: Normal range of motion.  Neurological: He is alert and oriented to person, place, and time.  Skin: Skin is warm and dry. No rash noted.  Psychiatric: His speech is normal. He is withdrawn. Cognition and  memory are normal. He exhibits a depressed mood. He expresses suicidal ideation. He expresses suicidal plans.    ED Course  Procedures (including critical care time) Labs Review Labs Reviewed  CBC - Abnormal; Notable for the following:    WBC 12.6 (*)    All other components within normal limits  COMPREHENSIVE METABOLIC PANEL  ETHANOL  SALICYLATE LEVEL  ACETAMINOPHEN LEVEL  URINE RAPID DRUG SCREEN, HOSP PERFORMED    Imaging Review No  results found. I have personally reviewed and evaluated these images and lab results as part of my medical decision-making.   EKG Interpretation None      MDM   Final diagnoses:  None    1. Suicidal ideation  Patient with history of SI, under IVC for same. TTS consult requested for placement and further treatment.     Elpidio Anis, PA-C 03/07/15 1610  Loren Racer, MD 03/07/15 [phone]

## 2015-03-08 DIAGNOSIS — F329 Major depressive disorder, single episode, unspecified: Secondary | ICD-10-CM | POA: Diagnosis present

## 2015-03-08 MED ORDER — WHITE PETROLATUM GEL
Status: AC
  Administered 2015-03-08: 15:00:00
  Filled 2015-03-08: qty 5

## 2015-03-08 MED ORDER — HYDROXYZINE HCL 50 MG PO TABS
50.0000 mg | ORAL_TABLET | Freq: Three times a day (TID) | ORAL | Status: DC | PRN
  Administered 2015-03-08 – 2015-03-13 (×9): 50 mg via ORAL
  Filled 2015-03-08 (×10): qty 1

## 2015-03-08 MED ORDER — MAGNESIUM HYDROXIDE 400 MG/5ML PO SUSP
30.0000 mL | Freq: Every day | ORAL | Status: DC | PRN
  Filled 2015-03-08: qty 30

## 2015-03-08 MED ORDER — ALUM & MAG HYDROXIDE-SIMETH 200-200-20 MG/5ML PO SUSP: 30.0000 mL | ORAL | Status: DC | PRN

## 2015-03-08 NOTE — [facility] Suicide Risk Assessment (Signed)
[facility] Admission Suicide Risk Assessment   Nursing information obtained from:    Demographic factors:    Current Mental Status:    Loss Factors:    Historical Factors:    Risk Reduction Factors:    Total Time spent with patient: 1 hour Principal Problem: <principal problem not specified> Diagnosis:   Patient Active Problem List   Diagnosis Date Noted  . Severe recurrent major depressive disorder with psychotic features [F33.3] 03/08/2015  . Severe major depression, single episode, without psychotic features [F32.2] 03/07/2015  . Opioid use disorder, moderate, dependence [F11.20] 03/07/2015  . Cannabis use disorder, severe, dependence [F12.20] 03/07/2015  . Tobacco use disorder [Z72.0] 03/07/2015     Continued Clinical Symptoms:  Alcohol Use Disorder Identification Test Final Score (AUDIT): 4 The "Alcohol Use Disorders Identification Test", Guidelines for Use in Primary Care, Second Edition.  World Science writer [facility]). Score between 0-7:  no or low risk or alcohol related problems. Score between 8-15:  moderate risk of alcohol related problems. Score between 16-19:  high risk of alcohol related problems. Score 20 or above:  warrants further diagnostic evaluation for alcohol dependence and treatment.   CLINICAL FACTORS:   Depression:   Comorbid alcohol abuse/dependence Hopelessness Severe Alcohol/Substance Abuse/Dependencies   Musculoskeletal: Strength & Muscle Tone: within normal limits Gait & Station: normal Patient leans: N/A  Psychiatric Specialty Exam: Physical exam performed in the emergency room and admitted with the findings. Physical Exam  Nursing note and vitals reviewed.   Review of Systems  Gastrointestinal: Positive for nausea and diarrhea.  Musculoskeletal: Positive for myalgias and back pain.  All other systems reviewed and are negative.   Blood pressure 111/73, pulse 80, temperature 98.5 F (36.9 C), temperature source Oral, resp. rate 18, height   (1.905 m), weight 83.462 kg (184 lb), SpO2 98 %.Body mass index is 23 kg/(m^2).  General Appearance: Casual  Eye Contact::  Good  Speech:  Clear and Coherent  Volume:  Decreased  Mood:  Depressed, Hopeless and Worthless  Affect:  Flat  Thought Process:  Goal Directed  Orientation:  Full (Time, Place, and Person)  Thought Content:  WDL  Suicidal Thoughts:  Yes.  with intent/plan  Homicidal Thoughts:  No  Memory:  Immediate;   Fair Recent;   Fair Remote;   Fair  Judgement:  Poor  Insight:  Shallow  Psychomotor Activity:  Decreased  Concentration:  Fair  Recall:  [employer] of Knowledge:Fair  Language: Fair  Akathisia:  No  Handed:  Right  AIMS (if indicated):     Assets:  Communication Skills Desire for Improvement Physical Health Resilience  Sleep:  Number of Hours: 7.15  Cognition: WNL  ADL's:  Intact     COGNITIVE FEATURES THAT CONTRIBUTE TO RISK:  None    SUICIDE RISK:   Moderate:  Frequent suicidal ideation with limited intensity, and duration, some specificity in terms of plans, no associated intent, good self-control, limited dysphoria/symptomatology, some risk factors present, and identifiable protective factors, including available and accessible social support.  PLAN OF CARE: Hospital admission, medication management, substance abuse counseling, discharge planning.  Medical Decision Making:  New problem, with additional work up planned, Review of Psycho-Social Stressors (1), Review or order clinical lab tests (1) and Review of Medication Regimen & Side Effects (2)   Mr. Penick is a 29 year old male with a history of narcotic dependence admitted for suicidal ideation in the context of major loss and severe social stressors.  1. The patient is able  to contract for safety in the hospital.  2. Mood. We will start Prozac for depression.  3. Opiate withdrawal. This is treated symptomatically.  4. Smoking. Nicotine patch is available.  5. Substance abuse  treatment. The patient minimizes problems but is interested in residential substance abuse treatment.  6. Chronic pain. The patient reports back pain of several years duration that was treated with narcotic painkillers and the [place]. this is how his habit started. In reviewing his chart I noticed that there is no evidence of any back problems in the lumbar or thoracic spine x-rays performed in 2012.  7. Insomnia. We started Seroquel.  8. Disposition. To be established.  I certify that inpatient services furnished can reasonably be expected to improve the patient's condition.   Jolanta Pucilowska 03/08/2015, 12:18 PM

## 2015-03-08 NOTE — Progress Notes (Signed)
His mood is sad & depressed.Positive for passive suicidal ideation.States"I don't have a place to go,I don't have money for anything."Reassured & helped him for positive copying skills.Attended groups & compliant with meds.

## 2015-03-08 NOTE — Progress Notes (Signed)
Patient approached nurse stating he was having pain in chest.  Tech took vitals and all where WNL.  Nurse went to assess and patient stated it was a feeling like a bubble in his chest.  Patient stated no tightness or heaviness in chest.  And no tingling.  Nurse thinks that it might be indigestion along with some anxiety.    Patient sitting on the side of the bed now and in no distress at this time.  Will continue to assess.

## 2015-03-08 NOTE — [facility] Group Notes (Signed)
[facility] LCSW Group Therapy  03/08/2015 3:29 PM  Type of Therapy:  Group Therapy  Participation Level:  Minimal  Participation Quality:  Appropriate and Attentive  Affect:  Appropriate  Cognitive:  Alert, Appropriate and Oriented  Insight:  Improving  Engagement in Therapy:  Improving  Modes of Intervention:  Socialization and Support  Summary of Progress/Problems: Patient arrived late for group and participated minimally but was attentive throughout group discussion. Patient reports legal issues and shared that this has affected him getting a job and in feels that no area of his life has balance but understands from feedback of other group members that he will focus on one thing at a time.   Lulu Riding, MSW, LCSWA 03/08/2015, 3:29 PM

## 2015-03-08 NOTE — H&P (Addendum)
Psychiatric Admission Assessment Adult  Patient Identification: Dave Wolf MRN:  [id number] Date of Evaluation:  03/08/2015 Chief Complaint:  depression Principal Diagnosis: <principal problem not specified> Diagnosis:   Patient Active Problem List   Diagnosis Date Noted  . Severe recurrent major depressive disorder with psychotic features [F33.3] 03/08/2015  . Severe major depression, single episode, without psychotic features [F32.2] 03/07/2015  . Opioid use disorder, moderate, dependence [F11.20] 03/07/2015  . Cannabis use disorder, severe, dependence [F12.20] 03/07/2015  . Tobacco use disorder [Z72.0] 03/07/2015   History of Present Illness:  Identifying data. Dave Wolf is a 29 year old male with history of narcotic dependence.  Chief complaint. "I need help."  History of present illness.  Information was obtained from the patient and the chart. Dave Wolf has had a history of narcotic abuse for several years now. He believes that he suffered a back injury while in the [employer] and needs narcotics to treat his pain. There is no objective evidence in the chart that this is the case. He reports that his first dose of narcotics were prescribed at the [facility] for bulging disc several years ago. He then went on to abusing painkillers. He reports that he had not been using any 2 months ago where his wife asked him to leave. She reportedly fell out of love with him. She wants him to sign separation papers. She refuses to let him see their 2 children after he signs the patient. The patient was a stay home for the past 5 years and misses his children. He's been homeless stay with friends ever since. He relapsed on narcotics. He reports many symptoms of depression with poor sleep, decreased appetite, anhedonia, feeling of energy and concentration, social isolation, crying spells, heightened anxiety. He denies psychotic symptoms. He denies symptoms suggestive of bipolar mania. He denies symptoms of OCD or PTSD.  He does have panic attacks. He does not use alcohol. He is a daily marijuana smoker.  Past psychiatric history. She was hospitalized at the [facility] for 1 day 2 years ago for a nervous breakdown. He never followed up. He never took any medications. There were no suicide attempts.  Family psychiatric history. His mother is a substance user.  Social history. He is few hours short of getting his degree in criminal justice. He used to work in the past but not recently as he was taking care of his children. He is now separated from his wife. He is homeless. It is unclear if he still has health insurance.  Total Time spent with patient: 1 hour  Past Psychiatric History:   Risk to Self: Is patient at risk for suicide?: Yes Risk to Others:   Prior Inpatient Therapy:   Prior Outpatient Therapy:    Alcohol Screening: 1. How often do you have a drink containing alcohol?: Never 2. How many drinks containing alcohol do you have on a typical day when you are drinking?: 10 or more 3. How often do you have six or more drinks on one occasion?: Never Preliminary Score: 4 4. How often during the last year have you found that you were not able to stop drinking once you had started?: Never 5. How often during the last year have you failed to do what was normally expected from you becasue of drinking?: Never 6. How often during the last year have you needed a first drink in the morning to get yourself going after a heavy drinking session?: Never 7. How often during the last year have you had  a feeling of guilt of remorse after drinking?: Never 8. How often during the last year have you been unable to remember what happened the night before because you had been drinking?: Never 9. Have you or someone else been injured as a result of your drinking?: No 10. Has a relative or friend or a doctor or another health worker been concerned about your drinking or suggested you cut down?: No Alcohol Use Disorder Identification  Test Final Score (AUDIT): 4 Brief Intervention: AUDIT score less than 7 or less-screening does not suggest unhealthy drinking-brief intervention not indicated Substance Abuse History in the last 12 months:  Yes.   Consequences of Substance Abuse: Negative Previous Psychotropic Medications: No  Psychological Evaluations: No  Past Medical History:  Past Medical History  Diagnosis Date  . Bulging disc   . DDD (degenerative disc disease), lumbar     Past Surgical History  Procedure Laterality Date  . Appendectomy    . Femur traction Right    Family History: History reviewed. No pertinent family history. Family Psychiatric  History:  Social History:  History  Alcohol Use No     History  Drug Use  . Yes  . Special: Marijuana    Comment: 2-3x monthly    Social History   Social History  . Marital Status: Married    Spouse Name: N/A  . Number of Children: N/A  . Years of Education: N/A   Social History Main Topics  . Smoking status: Current Every Day Smoker -- 0.75 packs/day    Types: Cigarettes  . Smokeless tobacco: None  . Alcohol Use: No  . Drug Use: Yes    Special: Marijuana     Comment: 2-3x monthly  . Sexual Activity: Not Asked   Other Topics Concern  . None   Social History Narrative   Additional Social History:                         Allergies:   Allergies  Allergen Reactions  . Bee Venom Other (See Comments)    Cant remember specific reaction   Lab Results:  Results for orders placed or performed during the hospital encounter of 03/07/15 (from the past 48 hour(s))  Comprehensive metabolic panel     Status: Abnormal   Collection Time: 03/07/15  5:05 AM  Result Value Ref Range   Sodium 139 135 - 145 mmol/L   Potassium 3.9 3.5 - 5.1 mmol/L   Chloride 104 101 - 111 mmol/L   CO2 25 22 - 32 mmol/L   Glucose, Bld 103 (H) 65 - 99 mg/dL   BUN 8 6 - 20 mg/dL   Creatinine, Ser 0.76 0.61 - 1.24 mg/dL   Calcium 9.5 8.9 - 10.3 mg/dL   Total  Protein 6.9 6.5 - 8.1 g/dL   Albumin 4.3 3.5 - 5.0 g/dL   AST 21 15 - 41 U/L   ALT 18 17 - 63 U/L   Alkaline Phosphatase 73 38 - 126 U/L   Total Bilirubin 0.8 0.3 - 1.2 mg/dL   GFR calc non Af Amer >60 >60 mL/min   GFR calc Af Amer >60 >60 mL/min    Comment: (NOTE) The eGFR has been calculated using the CKD EPI equation. This calculation has not been validated in all clinical situations. eGFR's persistently <60 mL/min signify possible Chronic Kidney Disease.    Anion gap 10 5 - 15  Ethanol (ETOH)     Status: None  Collection Time: 03/07/15  5:05 AM  Result Value Ref Range   Alcohol, Ethyl (B) <5 <5 mg/dL    Comment:        LOWEST DETECTABLE LIMIT FOR SERUM ALCOHOL IS 5 mg/dL FOR MEDICAL PURPOSES ONLY   Salicylate level     Status: None   Collection Time: 03/07/15  5:05 AM  Result Value Ref Range   Salicylate Lvl <7.6 2.8 - 30.0 mg/dL  Acetaminophen level     Status: Abnormal   Collection Time: 03/07/15  5:05 AM  Result Value Ref Range   Acetaminophen (Tylenol), Serum <10 (L) 10 - 30 ug/mL    Comment:        THERAPEUTIC CONCENTRATIONS VARY SIGNIFICANTLY. A RANGE OF 10-30 ug/mL MAY BE AN EFFECTIVE CONCENTRATION FOR MANY PATIENTS. HOWEVER, SOME ARE BEST TREATED AT CONCENTRATIONS OUTSIDE THIS RANGE. ACETAMINOPHEN CONCENTRATIONS >150 ug/mL AT 4 HOURS AFTER INGESTION AND >50 ug/mL AT 12 HOURS AFTER INGESTION ARE OFTEN ASSOCIATED WITH TOXIC REACTIONS.   CBC     Status: Abnormal   Collection Time: 03/07/15  5:05 AM  Result Value Ref Range   WBC 12.6 (H) 4.0 - 10.5 K/uL   RBC 4.86 4.22 - 5.81 MIL/uL   Hemoglobin 14.8 13.0 - 17.0 g/dL   HCT 44.8 39.0 - 52.0 %   MCV 92.2 78.0 - 100.0 fL   MCH 30.5 26.0 - 34.0 pg   MCHC 33.0 30.0 - 36.0 g/dL   RDW 12.9 11.5 - 15.5 %   Platelets 327 150 - 400 K/uL  Urine rapid drug screen (hosp performed) (Not at [facility])     Status: Abnormal   Collection Time: 03/07/15  5:13 AM  Result Value Ref Range   Opiates POSITIVE (A) NONE  DETECTED   Cocaine NONE DETECTED NONE DETECTED   Benzodiazepines NONE DETECTED NONE DETECTED   Amphetamines NONE DETECTED NONE DETECTED   Tetrahydrocannabinol POSITIVE (A) NONE DETECTED   Barbiturates NONE DETECTED NONE DETECTED    Comment:        DRUG SCREEN FOR MEDICAL PURPOSES ONLY.  IF CONFIRMATION IS NEEDED FOR ANY PURPOSE, NOTIFY LAB WITHIN 5 DAYS.        LOWEST DETECTABLE LIMITS FOR URINE DRUG SCREEN Drug Class       Cutoff (ng/mL) Amphetamine      1000 Barbiturate      200 Benzodiazepine   195 Tricyclics       093 Opiates          300 Cocaine          300 THC              50     Metabolic Disorder Labs:  No results found for: HGBA1C, MPG No results found for: PROLACTIN No results found for: CHOL, TRIG, HDL, CHOLHDL, VLDL, LDLCALC  Current Medications: Current Facility-Administered Medications  Medication Dose Route Frequency Provider Last Rate Last Dose  . alum & mag hydroxide-simeth (MAALOX/MYLANTA) 200-200-20 MG/5ML suspension 30 mL  30 mL Oral Q4H PRN Jolanta B Pucilowska, MD      . cloNIDine (CATAPRES) tablet 0.1 mg  0.1 mg Oral QID Clovis Fredrickson, MD   0.1 mg at 03/08/15 0941  . cyclobenzaprine (FLEXERIL) tablet 5 mg  5 mg Oral TID Clovis Fredrickson, MD   5 mg at 03/08/15 0937  . ibuprofen (ADVIL,MOTRIN) tablet 800 mg  800 mg Oral Q6H PRN Jolanta B Pucilowska, MD   800 mg at 03/08/15 0938  . lidocaine (LIDODERM) 5 %  1 patch  1 patch Transdermal Q24H Clovis Fredrickson, MD   1 patch at 03/08/15 0939  . loperamide (IMODIUM) capsule 4 mg  4 mg Oral PRN Jolanta B Pucilowska, MD      . magnesium hydroxide (MILK OF MAGNESIA) suspension 30 mL  30 mL Oral Daily PRN Jolanta B Pucilowska, MD      . nicotine (NICODERM CQ - dosed in mg/24 hours) patch 21 mg  21 mg Transdermal Daily Clovis Fredrickson, MD   21 mg at 03/08/15 0937  . QUEtiapine (SEROQUEL) tablet 100 mg  100 mg Oral QHS Clovis Fredrickson, MD   100 mg at 03/07/15 2143   PTA  Medications: Prescriptions prior to admission  Medication Sig Dispense Refill Last Dose  . acetaminophen (TYLENOL) 500 MG tablet Take 1,000 mg by mouth every 6 (six) hours as needed for mild pain.   Past Month at Unknown time  . Alum & Mag Hydroxide-Simeth (MAGIC MOUTHWASH W/LIDOCAINE) SOLN Take 5 mLs by mouth 4 (four) times daily as needed for mouth pain. (Patient not taking: Reported on 03/07/2015) 120 mL 0 Completed Course at Unknown time  . naproxen (NAPROSYN) 500 MG tablet Take 1 tablet (500 mg total) by mouth 2 (two) times daily with a meal. (Patient not taking: Reported on 03/07/2015) 30 tablet 0 Completed Course at Unknown time    Musculoskeletal: Strength & Muscle Tone: within normal limits Gait & Station: normal Patient leans: N/A  Psychiatric Specialty Exam: Physical Exam  Nursing note and vitals reviewed.   Review of Systems  Gastrointestinal: Positive for nausea and diarrhea.  Musculoskeletal: Positive for myalgias and back pain.  All other systems reviewed and are negative.   Blood pressure 111/73, pulse 80, temperature 98.5 F (36.9 C), temperature source Oral, resp. rate 18, height 6' 3"  (1.905 m), weight 83.462 kg (184 lb), SpO2 98 %.Body mass index is 23 kg/(m^2).  See SRA.                                                  Sleep:  Number of Hours: 7.15     Treatment Plan Summary: Daily contact with patient to assess and evaluate symptoms and progress in treatment and Medication management   Dave Wolf is a 29 year old male with a history of narcotic dependence admitted for suicidal ideation in the context of major loss and severe social stressors.  1. The patient is able to contract for safety in the hospital.  2. Mood. We will start Prozac for depression.  3. Opiate withdrawal. This is treated symptomatically.  4. Smoking. Nicotine patch is available.  5. Substance abuse treatment. The patient minimizes problems but is interested in  residential substance abuse treatment.  6. Chronic pain. The patient reports back pain of several years duration that was treated with narcotic painkillers and the [place]. this is how his habit started. In reviewing his chart I noticed that there is no evidence of any back problems in the lumbar or thoracic spine x-rays performed in 2012.  7. Insomnia. We started Seroquel.  8. Disposition. To be established.   Observation Level/Precautions:  15 minute checks  Laboratory:  CBC Chemistry Profile UDS UA  Psychotherapy:    Medications:    Consultations:    Discharge Concerns:    Estimated LOS:  Other:     I certify that  inpatient services furnished can reasonably be expected to improve the patient's condition.   Jolanta Pucilowska 9/29/201612:25 PM

## 2015-03-08 NOTE — Plan of Care (Signed)
Problem: Alteration in mood Goal: LTG-Patient reports reduction in suicidal thoughts (Patient reports reduction in suicidal thoughts and is able to verbalize a safety plan for whenever patient is feeling suicidal)  Outcome: Progressing Denies suicidal ideation.     

## 2015-03-08 NOTE — Progress Notes (Signed)
   03/08/15 1006  Clinical Encounter Type  Visited With Patient  Visit Type Initial;Spiritual support  Referral From Nurse  Consult/Referral To Chaplain  Spiritual Encounters  Spiritual Needs Emotional  Paged to unit to speak to patient who was having a difficult time.  Talked with patient and provided pastoral presence and support.  Visit ended abruptly when Chaplain was paged to a Rapid Response and had to leave.  When chaplain returned in the afternoon, patient no longer want to talk with Chaplain.  [employer] [place]-pager [phone]

## 2015-03-08 NOTE — Consult Note (Signed)
Called as patient was complain of anxiety. Per nursing he is denying any pain. It appears earlier he reported a sensation of bubble in his chest. He is reportedly no longer complaining of this presentglyNursing not at that time was written at PM with vitals reported normal at that time.  vitals currently are P 65 BP 105/75 RR18 O2 98%. Will prescribe vistaril 50 mg TID prn anxiety.

## 2015-03-08 NOTE — Plan of Care (Signed)
Problem: Alteration in mood Goal: LTG-Pt's behavior demonstrates decreased signs of depression (Patient's behavior demonstrates decreased signs of depression to the point the patient is safe to return home and continue treatment in an outpatient setting)  Outcome: Not Progressing Verbalized depression & hopelessness.

## 2015-03-08 NOTE — [facility] Group Notes (Signed)
[facility] Group Notes:  (Nursing/MHT/Case Management/Adjunct)  Date:  03/08/2015  Time:  1:29 PM  Type of Therapy:  Psychoeducational Skills  Participation Level:  Did Not Attend   Darrow Bussing 03/08/2015, 1:29 PM

## 2015-03-08 NOTE — [facility] Group Notes (Signed)
[facility] Group Notes:  (Nursing/MHT/Case Management/Adjunct)  Date:  03/08/2015  Time:  9:17 PM  Type of Therapy:  Group Therapy  Participation Level:  Active  Participation Quality:  Appropriate  Affect:  Appropriate  Cognitive:  Appropriate  Insight:  Appropriate  Engagement in Group:  Engaged  Modes of Intervention:  Discussion  Summary of Progress/Problems:  Dave Wolf 03/08/2015, 9:17 PM

## 2015-03-09 MED ORDER — PROMETHAZINE HCL 25 MG PO TABS
25.0000 mg | ORAL_TABLET | Freq: Four times a day (QID) | ORAL | Status: DC | PRN
  Administered 2015-03-12: 25 mg via ORAL
  Filled 2015-03-09: qty 1

## 2015-03-09 MED ORDER — TRAZODONE HCL 100 MG PO TABS
100.0000 mg | ORAL_TABLET | Freq: Every day | ORAL | Status: DC
  Administered 2015-03-09 – 2015-03-13 (×5): 100 mg via ORAL
  Filled 2015-03-09 (×5): qty 1

## 2015-03-09 NOTE — Progress Notes (Signed)
D: Pt is awake and active in the milieu this evening. Pt mood is depressed and his affect is sad/flat. Pt endorses passive SI but he does contract for safety. Pt explained that he had had some chest pain earlier in the day but not any longer, but still c/o anxiety.   A: Writer provided emotional support and administered medications as prescribed. Writer contacted MD who ordered PRN medication  R: Pt behavior is appropriate on the unit and he is pleasant and cooperative with staff. Pt went to bed following medication admininstration.

## 2015-03-09 NOTE — Plan of Care (Signed)
Problem: Ineffective individual coping Goal: STG: Pt will be able to identify effective and ineffective STG: Pt will be able to identify effective and ineffective coping patterns  Outcome: Progressing Attending unit programming

## 2015-03-09 NOTE — [facility] Counselor (Signed)
Adult Comprehensive Assessment  Patient ID: Dave Wolf, male   DOB: 1986/05/09, 29 y.o.   MRN: [id number]  Information Source: Information source: Patient  Current Stressors:  Employment / Job issues: unemployed Housing / Lack of housing: homeless Social relationships: wife separation and no able to see his 2 children Substance abuse: opiate withdrawal  Living/Environment/Situation:  Living Arrangements: Alone Living conditions (as described by patient or guardian): homeless  Family History:  Marital status: Separated Separated, when?: 2 months What types of issues is patient dealing with in the relationship?: wife dropped patient on side of road and put belonging in storage  Does patient have children?: Yes How many children?: 2 How is patient's relationship with their children?: good but does not get to see them like he wants due to hhis wife  Childhood History:  By whom was/is the patient raised?: Both parents  Education:  Highest grade of school patient has completed: some [employer] Learning disability?: No  Employment/Work Situation:   Employment situation: Unemployed 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, 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 Has patient ever been in the [employer]?: Yes (Describe in comment) (General discharge 2011) Has patient ever served in combat?: Yes  Financial Resources:   Financial resources: No income Does patient have a Lawyer or guardian?: No  Alcohol/Substance Abuse:   What has been your use of drugs/alcohol within the last 12 months?: opiates  Social Support System:   [employer] Support System: Fair  Leisure/Recreation:      Strengths/Needs:      Discharge Plan:   Does patient have access to transportation?: No Will patient be returning to same living situation after discharge?: No Currently receiving community mental health services: No If no, would patient like referral for services when discharged?: Yes (What  county?) ([place] or [place] needs to be established) Does patient have financial barriers related to discharge medications?: No  Summary/Recommendations:   Patient is a separated 29 yo wm admitted for SI and depression who is homeless and addicted to opiates with pain management concerns. Patient states he cannot afford txt and is encouraged to connect with VA services and can even get transportation from [place] to get to and from appts. Patient will also need housing and may have a inpatient bed at the [facility]. Patient is encouraged to participate in group therapy, med mngt, and therapeutic milieu.     Lulu Riding., MSW, Theresia Majors  03/09/2015

## 2015-03-09 NOTE — Progress Notes (Signed)
[facility] MD Progress Note  03/09/2015 1:24 PM Aison Malveaux  MRN:  [id number]  Subjective:  [name] is a 29 year old male with a history of opiate dependence. He was admitted for suicidal ideation in the context of severe social stressors. His wife through him out of the house and wants him to sign separation from. He has been homeless, unemployed, and unable to see his children.  Mr. Boliver feels very uncomfortable today with diarrhea, nausea, aches and pains. He complains of severe back pain for which he was prescribed Lidoderm patch. There is no objective findings in his medical history to support any back injury. His mood is still depressed with flat affect and passing suicidal thoughts. He slept last night and is able to hold his food down.  Principal Problem: Major depressive disorder, single episode with anxious distress Diagnosis:   Patient Active Problem List   Diagnosis Date Noted  . Major depressive disorder, single episode with anxious distress [F32.9] 03/08/2015  . Opioid use disorder, moderate, dependence [F11.20] 03/07/2015  . Cannabis use disorder, severe, dependence [F12.20] 03/07/2015  . Tobacco use disorder [Z72.0] 03/07/2015   Total Time spent with patient: 20 minutes  Past Psychiatric History: there is a long history of depression.  Past Medical History:  Past Medical History  Diagnosis Date  . Bulging disc   . DDD (degenerative disc disease), lumbar     Past Surgical History  Procedure Laterality Date  . Appendectomy    . Femur traction Right    Family History: History reviewed. No pertinent family history. Family Psychiatric  History: nonreported. Social History:  History  Alcohol Use No     History  Drug Use  . Yes  . Special: Marijuana    Comment: 2-3x monthly    Social History   Social History  . Marital Status: Married    Spouse Name: N/A  . Number of Children: N/A  . Years of Education: N/A   Social History Main Topics  . Smoking status:  Current Every Day Smoker -- 0.75 packs/day    Types: Cigarettes  . Smokeless tobacco: None  . Alcohol Use: No  . Drug Use: Yes    Special: Marijuana     Comment: 2-3x monthly  . Sexual Activity: Not Asked   Other Topics Concern  . None   Social History Narrative   Additional Social History:                         Sleep: Fair  Appetite:  Fair  Current Medications: Current Facility-Administered Medications  Medication Dose Route Frequency Provider Last Rate Last Dose  . alum & mag hydroxide-simeth (MAALOX/MYLANTA) 200-200-20 MG/5ML suspension 30 mL  30 mL Oral Q4H PRN Jolanta B Pucilowska, MD      . cloNIDine (CATAPRES) tablet 0.1 mg  0.1 mg Oral QID Shari Prows, MD   0.1 mg at 03/09/15 0903  . cyclobenzaprine (FLEXERIL) tablet 5 mg  5 mg Oral TID Shari Prows, MD   5 mg at 03/09/15 0903  . hydrOXYzine (ATARAX/VISTARIL) tablet 50 mg  50 mg Oral TID PRN Kerin Salen, MD   50 mg at 03/08/15 2136  . ibuprofen (ADVIL,MOTRIN) tablet 800 mg  800 mg Oral Q6H PRN Shari Prows, MD   800 mg at 03/09/15 0957  . lidocaine (LIDODERM) 5 % 1 patch  1 patch Transdermal Q24H Shari Prows, MD   1 patch at 03/09/15 0644  . loperamide (  IMODIUM) capsule 4 mg  4 mg Oral PRN Jolanta B Pucilowska, MD      . magnesium hydroxide (MILK OF MAGNESIA) suspension 30 mL  30 mL Oral Daily PRN Jolanta B Pucilowska, MD      . nicotine (NICODERM CQ - dosed in mg/24 hours) patch 21 mg  21 mg Transdermal Daily Jolanta B Pucilowska, MD   21 mg at 03/09/15 0905  . promethazine (PHENERGAN) tablet 25 mg  25 mg Oral Q6H PRN Jolanta B Pucilowska, MD      . QUEtiapine (SEROQUEL) tablet 100 mg  100 mg Oral QHS Jolanta B Pucilowska, MD   100 mg at 03/08/15 2136    Lab Results: No results found for this or any previous visit (from the past 48 hour(s)).  Physical Findings: AIMS: Facial and Oral Movements Muscles of Facial Expression: None, normal Lips and Perioral Area: None,  normal Jaw: None, normal Tongue: None, normal,Extremity Movements Upper (arms, wrists, hands, fingers): None, normal Lower (legs, knees, ankles, toes): None, normal, Trunk Movements Neck, shoulders, hips: None, normal, Overall Severity Severity of abnormal movements (highest score from questions above): None, normal Incapacitation due to abnormal movements: None, normal Patient's awareness of abnormal movements (rate only patient's report): No Awareness, Dental Status Current problems with teeth and/or dentures?: Yes Does patient usually wear dentures?: No  CIWA:    COWS:     Musculoskeletal: Strength & Muscle Tone: within normal limits Gait & Station: normal Patient leans: N/A  Psychiatric Specialty Exam: Review of Systems  Gastrointestinal: Positive for nausea, abdominal pain and diarrhea.  Musculoskeletal: Positive for myalgias.  All other systems reviewed and are negative.   Blood pressure 115/77, pulse 79, temperature 98.1 F (36.7 C), temperature source Oral, resp. rate 18, height  (1.905 m), weight 83.462 kg (184 lb), SpO2 98 %.Body mass index is 23 kg/(m^2).  General Appearance: Casual  Eye Contact::  Good  Speech:  Clear and Coherent  Volume:  Decreased  Mood:  Depressed, Hopeless and Worthless  Affect:  Flat  Thought Process:  Goal Directed  Orientation:  Full (Time, Place, and Person)  Thought Content:  WDL  Suicidal Thoughts:  Yes.  with intent/plan  Homicidal Thoughts:  No  Memory:  Immediate;   Fair Recent;   Fair Remote;   Fair  Judgement:  Fair  Insight:  Fair  Psychomotor Activity:  Decreased  Concentration:  Fair  Recall:  [employer] of Knowledge:Fair  Language: Fair  Akathisia:  No  Handed:  Right  AIMS (if indicated):     Assets:  Communication Skills Desire for Improvement Physical Health Resilience  ADL's:  Intact  Cognition: WNL  Sleep:  Number of Hours: 6.5   Treatment Plan Summary: Daily contact with patient to assess and  evaluate symptoms and progress in treatment and Medication management   Mr. Macpherson is a 29 year old male with a history of narcotic dependence admitted for suicidal ideation in the context of major loss and severe social stressors.  1. Suicidal ideation.The patient is able to contract for safety in the hospital.  2. Mood. We we start Prozac for depression and Vistaril for anxiety.  3. Opiate withdrawal. This is treated symptomatically.  4. Smoking. Nicotine patch is available.  5. Substance abuse treatment. The patient minimizes problems but is interested in residential substance abuse treatment.  6. Chronic pain. The patient reports back pain of several years duration that was treated with narcotic painkillers and the [place]. this is how his habit started.  In reviewing his chart I noticed that there is no evidence of any back problems in the lumbar or thoracic spine x-rays performed in 2012.  7. Insomnia. We started Seroquel the patient complains of restlessness. We'll stop Seroquel and offer trazodone.  8. Disposition. To be established.   Jolanta Pucilowska 03/09/2015, 1:24 PM

## 2015-03-09 NOTE — [facility] Group Notes (Signed)
[facility] LCSW Group Therapy  03/09/2015 12:21 PM  Type of Therapy:  Group Therapy  Participation Level:  Minimal  Participation Quality:  Attentive  Affect:  Depressed  Cognitive:  Alert  Insight:  Limited  Engagement in Therapy:  Limited  Modes of Intervention:  Discussion, Education, Socialization and Support  Summary of Progress/Problems: Feelings around Relapse. Group members discussed the meaning of relapse and shared personal stories of relapse, how it affected them and others, and how they perceived themselves during this time. Group members were encouraged to identify triggers, warning signs and coping skills used when facing the possibility of relapse. Social supports were discussed and explored in detail. Dave Wolf attended group towards the end. He stated relationship problems impacted his relapse.   [employer] MSW, LCSWA  03/09/2015, 12:21 PM

## 2015-03-09 NOTE — Tx Team (Signed)
Interdisciplinary Treatment Plan Update (Adult)  Date:  03/09/2015 Time Reviewed:  3:42 PM  Progress in Treatment: Attending groups: Yes. Participating in groups:  Yes. Taking medication as prescribed:  Yes. Tolerating medication:  Yes. Family/Significant othe contact made:  No, will contact:  Daegon Deiss (father) [phone] Patient understands diagnosis:  Yes. Discussing patient identified problems/goals with staff:  Yes. Medical problems stabilized or resolved:  Yes. Denies suicidal/homicidal ideation: Yes. Issues/concerns per patient self-inventory:  No. Other:  New problem(s) identified: No, Describe:  none reported  Discharge Plan or Barriers: Patient is homeless and would like inpatient treatment  Reason for Continuation of Hospitalization: Depression Medication stabilization Suicidal ideation  Comments:  Estimated length of stay: up to 4 days expected discharge 03/12/15 Monday  New goal(s):  Review of initial/current patient goals per problem list:   1.  Goal(s): decrease depression  Met:  No  Target date: 03/12/15  2.  Goal (s): pain management   Met:  No  Target date: 03/12/15  3.  Goal(s): illiminate SI  Met:  Yes  Target date: 03/12/15  As evidenced by: pateint's report of no SI  Attendees: Physician:  Orson Slick, MD 9/30/20163:42 PM  Nursing:   Elige Radon, RN 9/30/20163:42 PM  Other:  Carmell Austria, [place] 9/30/20163:42 PM  Other:   9/30/20163:42 PM  Other:   9/30/20163:42 PM  Other:  9/30/20163:42 PM  Other:  9/30/20163:42 PM  Other:  9/30/20163:42 PM  Other:  9/30/20163:42 PM  Other:  9/30/20163:42 PM  Other:  9/30/20163:42 PM  Other:   9/30/20163:42 PM   Scribe for Treatment Team:   Keene Breath, MSW, [place]  03/09/2015, 3:42 PM

## 2015-03-09 NOTE — Plan of Care (Signed)
Problem: Alteration in mood Goal: STG-Patient reports thoughts of self-harm to staff Outcome: Progressing Pt endorses passive SI but does contract for safety with staff.

## 2015-03-09 NOTE — [facility] Group Notes (Deleted)
[facility] Group Notes:  (Nursing/MHT/Case Management/Adjunct)  Date:  03/09/2015  Time:  12:02 PM  Type of Therapy:  Psychoeducational Skills  Participation Level:  Active  Participation Quality:  Appropriate  Affect:  Appropriate  Cognitive:  Appropriate  Insight:  Appropriate  Engagement in Group:  Engaged  Modes of Intervention:  Discussion and Education  Summary of Progress/Problems:  Dave Wolf 03/09/2015, 12:02 PM

## 2015-03-09 NOTE — [facility] Group Notes (Signed)
[facility] Group Notes:  (Nursing/MHT/Case Management/Adjunct)  Date:  03/09/2015  Time:  12:06 PM  Type of Therapy:  Psychoeducational Skills  Participation Level:  Did Not Attend    Mickey Farber 03/09/2015, 12:06 PM

## 2015-03-09 NOTE — Progress Notes (Signed)
D: Patient stated slept fair last night .Stated appetite is good and energy level  Is low. Stated concentration is good . Stated on Depression scale 8 , hopeless 10 and anxiety 7 .( low 0-10 high) Patient continue to voice of suicidal suicidal .  No auditory hallucinations  No pain concerns . Appropriate ADL'S. Interacting with peers and staff.  A: Encourage patient participation with unit programming . Patient wanted to remain in bed  States he is still detoxing . Instruction  Given on  Medication , verbalize understanding. R: Voice no other concerns. Staff continue to monitor

## 2015-03-10 ENCOUNTER — Encounter: Payer: Self-pay | Admitting: Psychiatry

## 2015-03-10 MED ORDER — INFLUENZA VAC SPLIT QUAD 0.5 ML IM SUSY
0.5000 mL | PREFILLED_SYRINGE | INTRAMUSCULAR | Status: AC
  Administered 2015-03-11: 0.5 mL via INTRAMUSCULAR
  Filled 2015-03-10: qty 0.5

## 2015-03-10 MED ORDER — FLUOXETINE HCL 20 MG PO CAPS
20.0000 mg | ORAL_CAPSULE | Freq: Every day | ORAL | Status: DC
Start: 2015-03-10 — End: 2015-03-12
  Administered 2015-03-10 – 2015-03-12 (×3): 20 mg via ORAL
  Filled 2015-03-10 (×3): qty 1

## 2015-03-10 MED ORDER — CLONIDINE HCL 0.1 MG PO TABS: 0.1000 mg | ORAL_TABLET | Freq: Two times a day (BID) | ORAL | Status: DC

## 2015-03-10 NOTE — [facility] Group Notes (Signed)
[facility] LCSW Group Therapy  03/10/2015 3:56 PM  Type of Therapy:  Group Therapy  Participation Level:  Active  Participation Quality:  Appropriate and Resistant  Affect:  Resistant  Cognitive:  Appropriate   Insight:  Engaged and Improving  Engagement in Therapy:  Engaged  Modes of Intervention:  Activity, Discussion, Exploration, Rapport Building and Support  Summary of Progress/Problems: Pt was able to identify 1 current support person, but had a list of potential people both professional and personal that he could enlist for support. Pt had limited insight in identifying ways to support himself or that friends have been supporting him with his housing.   Ned Card 03/10/2015, 3:56 PM

## 2015-03-10 NOTE — Plan of Care (Signed)
Problem: Spiritual Needs Goal: Ability to function at adequate level Outcome: Progressing Patient present in the milieu and able to independently complete ADL's.  Problem: Ineffective individual coping Goal: LTG: Patient will report a decrease in negative feelings Outcome: Not Progressing Patient continues to endorse depression, helplessness and hopelessness.  Goal: STG: Pt will be able to identify effective and ineffective STG: Pt will be able to identify effective and ineffective coping patterns  Outcome: Not Progressing Not progressing.  Goal: STG: Patient will remain free from self harm Outcome: Progressing No self harm.

## 2015-03-10 NOTE — [facility] Group Notes (Signed)
[facility] Group Notes:  (Nursing/MHT/Case Management/Adjunct)  Date:  03/10/2015  Time:  3:41 AM  Type of Therapy:  Group Therapy  Participation Level:  Active  Participation Quality:  Appropriate  Affect:  Appropriate  Cognitive:  Appropriate  Insight:  Appropriate  Engagement in Group:  Engaged  Modes of Intervention:  n/a  Summary of Progress/Problems:  Dave Wolf 03/10/2015, 3:41 AM

## 2015-03-10 NOTE — Progress Notes (Signed)
CSW spoke to Pt about doing psychosocial assessment after group. Pt stated that he has having uncomfortable pain and was not comfortable in a chair at this time. CSW agreed to meet with Pt on Sunday to finish assessment.   Dave Grist, LCSW [phone]
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
## 2015-03-10 NOTE — Progress Notes (Signed)
D:  Patient had flat, sad affect. Reported good appetite and fair sleep last night. Pain was rated a 7 and located in his lower back. Depression rated a 5. Feelings of hopelessness and helplessness rated a 10. Patient has passive suicidal ideation, no plan. Contracted for safety. Patient stated his suicidal ideation "comes and goes depending on what I am doing." Cooperative and pleasant with Clinical research associate.   A: Writer offered emotional support. Encouraged unit programming and alternate pain interventions (ice pack).    R: Patient is compliant with medications and verbalized understanding. Patient attended psychotherapy and another group this afternoon.

## 2015-03-11 ENCOUNTER — Inpatient Hospital Stay: Payer: 59

## 2015-03-11 MED ORDER — CYCLOBENZAPRINE HCL 5 MG PO TABS
7.5000 mg | ORAL_TABLET | Freq: Three times a day (TID) | ORAL | Status: DC
  Administered 2015-03-11 – 2015-03-14 (×11): 7.5 mg via ORAL
  Filled 2015-03-11 (×15): qty 1.5

## 2015-03-11 NOTE — Progress Notes (Signed)
Per [employer] in [place] this patient's date of birth is 02/14/1956 not 1985-12-21. This Clinical research associate will contact registration to correct.    Maryelizabeth Rowan, MSW, [facility] Triage Specialist [phone]

## 2015-03-11 NOTE — [facility] Group Notes (Signed)
  [facility] LCSW Group Therapy  03/11/2015 2:43 PM  Type of Therapy:  Group Therapy  Participation Level:  Minimal  Participation Quality:  Attentive  Affect:  Appropriate  Cognitive:  Alert  Insight:  Improving  Engagement in Therapy:  Improving  Modes of Intervention:  Discussion, Education, Socialization and Support  Summary of Progress/Problems: Pt will identify unhealthy thoughts and how they impact their emotions and behavior. Pt will be encouraged to discuss these thoughts, emotions and behaviors with the group. Pt attended group and stayed the entire time. He sat quietly and listened to other group members.   [employer] MSW, LCSWA  03/11/2015, 2:43 PM
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
## 2015-03-11 NOTE — Plan of Care (Signed)
Problem: Ineffective individual coping Goal: LTG: Patient will report a decrease in negative feelings Outcome: Not Progressing Patient continues to report depression and chronic pain.  Goal: STG: Pt will be able to identify effective and ineffective STG: Pt will be able to identify effective and ineffective coping patterns  Outcome: Progressing Patient attempting to be more interactive in the milieu and attend groups.  Goal: STG: Patient will remain free from self harm Outcome: Progressing No self harm.

## 2015-03-11 NOTE — Progress Notes (Signed)
D: patient still very depressed.  Patient states no SI or HI at this time.  Patient seen in milieu and day room interacting with patients.  Patients states that his energy level is low.  Patient states that his depression is a 9 with 10 being the worse.  Patient still experiencing pain.  Patient went down for a CT scan today but was negative.  Patient compliant with medications.  Patient in no distress at this time.  A: encouragement and support provided.  Medications given as prescribed.  Encouraged patient when to talk with staff if symptoms worsen  R: patient receptive of care.

## 2015-03-11 NOTE — Plan of Care (Signed)
Problem: Consults Goal: Depression Patient Education See Patient Education Module for education specifics.  Outcome: Progressing Patient is now expressing his feelings to staff members and attending groups.  Patient seems to be interacting more with staff and patients and coming out of his room more often this shift.

## 2015-03-12 MED ORDER — FLUOXETINE HCL 20 MG PO CAPS
40.0000 mg | ORAL_CAPSULE | Freq: Every day | ORAL | Status: DC
  Administered 2015-03-13 – 2015-03-14 (×2): 40 mg via ORAL
  Filled 2015-03-12 (×2): qty 2

## 2015-03-12 NOTE — [facility] Group Notes (Signed)
[facility] LCSW Group Therapy  03/12/2015 2:33 PM  Type of Therapy:  Group Therapy  Participation Level:  Did Not Attend   Lulu Riding, MSW, LCSWA 03/12/2015, 2:33 PM

## 2015-03-12 NOTE — Plan of Care (Signed)
Problem: Spiritual Needs Goal: Ability to function at adequate level Outcome: Progressing Patient maintaining ADL's, med compliant, interactive in milieu, pleasant and cooperative.   Problem: Ineffective individual coping Goal: LTG: Patient will report a decrease in negative feelings Outcome: Not Progressing Patient continues to endorse depression.

## 2015-03-12 NOTE — [facility] Group Notes (Signed)
[facility] LCSW Aftercare Discharge Planning Group Note   03/12/2015 3:14 PM  Participation Quality:  Did Not Attend  Mood/Affect:  Did Not Attend  Depression Rating:    Anxiety Rating:    Thoughts of Suicide:  Did Not Attend Will you contract for safety?   Did Not Attend  Current AVH:  Did Not Attend  Plan for Discharge/Comments:    Transportation Means:   Supports:  Glennon Mac

## 2015-03-12 NOTE — Progress Notes (Signed)
D: patient still very depressed. Patient states no SI or HI at this time. Patient seen in milieu and day room interacting with patients. Patiet complained of HA this am and was given medication as ordered. .  continues to complain of anxiety and asks for prn medication as ordered.  Patient in no distress at this time.  A: encouragement and support provided. Medications given as prescribed. Encouraged patient to talk with staff if symptoms worsen  R: patient receptive of care.
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
## 2015-03-13 MED ORDER — QUETIAPINE FUMARATE 25 MG PO TABS
25.0000 mg | ORAL_TABLET | Freq: Once | ORAL | Status: AC
  Administered 2015-03-13: 25 mg via ORAL
  Filled 2015-03-13: qty 1

## 2015-03-13 NOTE — Progress Notes (Signed)
   03/13/15 0900  Clinical Encounter Type  Visited With Patient  Visit Type Initial  Referral From Nurse  Consult/Referral To Chaplain  Spiritual Encounters  Spiritual Needs Emotional  Paged by nurse to speak with pt. Provided spiritual/emotional support. Talk for approx 45 minutes. Pt was talking optimistically, thinking in a positive direction. Chap. Karl Ito [phone]

## 2015-03-13 NOTE — [facility] Group Notes (Signed)
[facility] Group Notes:  (Nursing/MHT/Case Management/Adjunct)  Date:  03/13/2015  Time:  5:10 PM  Type of Therapy:  Psychoeducational Skills  Participation Level:  Active  Participation Quality:  Appropriate, Attentive and Sharing  Affect:  Appropriate  Cognitive:  Alert and Appropriate  Insight:  Appropriate and Good  Engagement in Group:  Engaged  Modes of Intervention:  Discussion, Education and Support  Summary of Progress/Problems:  Dave Wolf 03/13/2015, 5:10 PM

## 2015-03-13 NOTE — Progress Notes (Signed)
D: Patient has attended groups and interacted on the milieu today. He rates his level of depression 5/10 and anxiety 7/10. Denies SI/HI/AVH. Mood is calm and cooperative. A: On q 15 minute checks. Given meds. R: No acute distress.

## 2015-03-13 NOTE — Progress Notes (Signed)
D: Pt passive SI-contracts for safety. denies HI/AVH. Pt is pleasant and cooperative. 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   A: Pt was offered support and encouragement. Pt was given scheduled medications. Pt was encourage to attend groups. Q 15 minute checks were done for safety.   R:Pt attends groups and interacts well with peers and staff. Pt is taking medication. Pt has no complaints at this time .Pt receptive to treatment and safety maintained on unit.
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
## 2015-03-13 NOTE — Plan of Care (Signed)
Problem: Ineffective individual coping Goal: STG: Patient will remain free from self harm Outcome: Progressing Pt safe on the unit at this time   Problem: Alteration in mood Goal: LTG-Patient reports reduction in suicidal thoughts (Patient reports reduction in suicidal thoughts and is able to verbalize a safety plan for whenever patient is feeling suicidal)  Outcome: Not Progressing Pt SI-contracts for safety

## 2015-03-14 MED ORDER — FLUOXETINE HCL 40 MG PO CAPS: 40.0000 mg | ORAL_CAPSULE | Freq: Every day | ORAL | Status: DC

## 2015-03-14 MED ORDER — QUETIAPINE FUMARATE 100 MG PO TABS: 100.0000 mg | ORAL_TABLET | Freq: Every day | ORAL | Status: AC

## 2015-03-14 MED ORDER — CYCLOBENZAPRINE HCL 7.5 MG PO TABS: 7.5000 mg | ORAL_TABLET | Freq: Three times a day (TID) | ORAL | Status: DC

## 2015-03-14 MED ORDER — QUETIAPINE FUMARATE 25 MG PO TABS: 25.0000 mg | ORAL_TABLET | Freq: Three times a day (TID) | ORAL | Status: DC

## 2015-03-14 MED ORDER — QUETIAPINE FUMARATE 100 MG PO TABS: 100.0000 mg | ORAL_TABLET | Freq: Every day | ORAL | Status: DC

## 2015-03-14 MED ORDER — LIDOCAINE 5 % EX PTCH: 1.0000 | MEDICATED_PATCH | CUTANEOUS | Status: AC

## 2015-03-14 MED ORDER — TRAZODONE HCL 100 MG PO TABS: 100.0000 mg | ORAL_TABLET | Freq: Every day | ORAL | Status: DC

## 2015-03-14 MED ORDER — CYCLOBENZAPRINE HCL 7.5 MG PO TABS: 7.5000 mg | ORAL_TABLET | Freq: Three times a day (TID) | ORAL | Status: AC

## 2015-03-14 MED ORDER — TRAZODONE HCL 100 MG PO TABS: 100.0000 mg | ORAL_TABLET | Freq: Every day | ORAL | Status: AC

## 2015-03-14 MED ORDER — QUETIAPINE FUMARATE 25 MG PO TABS
25.0000 mg | ORAL_TABLET | Freq: Three times a day (TID) | ORAL | Status: DC
  Administered 2015-03-14 (×2): 25 mg via ORAL
  Filled 2015-03-14 (×2): qty 1

## 2015-03-14 MED ORDER — FLUOXETINE HCL 40 MG PO CAPS: 40.0000 mg | ORAL_CAPSULE | Freq: Every day | ORAL | Status: AC

## 2015-03-14 MED ORDER — LIDOCAINE 5 % EX PTCH: 1.0000 | MEDICATED_PATCH | CUTANEOUS | Status: DC

## 2015-03-14 NOTE — [facility] Group Notes (Signed)
[facility] Group Notes:  (Nursing/MHT/Case Management/Adjunct)  Date:  03/14/2015  Time:  2:20 PM  Type of Therapy:  Psychoeducational Skills  Participation Level:  None  Participation Quality:  Resistant  Affect:  Flat  Cognitive:  Lacking  Insight:  None  Engagement in Group:  None  Modes of Intervention:  Discussion, Education and Support  Summary of Progress/Problems:  Dave Wolf 03/14/2015, 2:20 PM

## 2015-03-14 NOTE — Progress Notes (Signed)
D: Patient stated slept good last night .Stated appetite is good and energy level  Is normal. Stated concentration is good . Stated on Depression scale 4 , hopeless 8 and anxiety 3 .( low 0-10 high) Denies suicidal  homicidal ideations  .  No auditory hallucinations  No pain concerns . Appropriate ADL'S. Interacting with peers and staff. Stated he is still having withdrawals with craving and runny nose A: Encourage patient participation with unit programming . Instruction  Given on  Medication , verbalize understanding. R: Voice no other concerns. Staff continue to monitor   D:Patient aware of discharge this shift . Patient returning home . Patient received all belonging locked up . Patient denies  Suicidal  And homicidal ideations  .  A: Writer instructed on discharge criteria  . Informed of 7 day supply  Of medication  and prescriptions f . Aware  Of follow up appointment . R: Patient left unit with no questions  Or concerns  Bus to [employer]

## 2015-03-14 NOTE — Progress Notes (Signed)
Patient in his room at the start of the shift, A&Ox3, rated chronic back pain 7/10, ibuprofen effective, proud to show me his 2 sons' picture, then said, "my wife asked me to leave, she said she fell out of love with me, I lost my job, started to hang out with old friends and went back on drugs ..." denied SIB/SI/HI, denied AV/H, interacting well with peers, no behavioral problems, will continue with POC.

## 2015-03-14 NOTE — [facility] Suicide Risk Assessment (Signed)
[facility] Discharge Suicide Risk Assessment   Demographic Factors:  Male, Divorced or widowed, Caucasian, Low socioeconomic status and Unemployed  Total Time spent with patient: 30 minutes  Musculoskeletal: Strength & Muscle Tone: within normal limits Gait & Station: normal Patient leans: N/A  Psychiatric Specialty Exam: Physical Exam  Nursing note and vitals reviewed.   Review of Systems  Musculoskeletal: Positive for back pain.  All other systems reviewed and are negative.   Blood pressure 123/85, pulse 90, temperature 97.9 F (36.6 C), temperature source Oral, resp. rate 18, height  (1.905 m), weight 83.462 kg (184 lb), SpO2 98 %.Body mass index is 23 kg/(m^2).  General Appearance: Casual  Eye Contact::  Good  Speech:  Normal Rate409  Volume:  Normal  Mood:  Euthymic  Affect:  Appropriate  Thought Process:  Goal Directed  Orientation:  Full (Time, Place, and Person)  Thought Content:  WDL  Suicidal Thoughts:  No  Homicidal Thoughts:  No  Memory:  Immediate;   Fair Recent;   Fair Remote;   Fair  Judgement:  Fair  Insight:  Fair  Psychomotor Activity:  Normal  Concentration:  Fair  Recall:  [employer] of Knowledge:Fair  Language: Fair  Akathisia:  No  Handed:  Right  AIMS (if indicated):     Assets:  Communication Skills Desire for Improvement Physical Health Resilience  Sleep:  Number of Hours: 6  Cognition: WNL  ADL's:  Intact   Have you used any form of tobacco in the last 30 days? (Cigarettes, Smokeless Tobacco, Cigars, and/or Pipes): Yes  Has this patient used any form of tobacco in the last 30 days? (Cigarettes, Smokeless Tobacco, Cigars, and/or Pipes) Yes, A prescription for an FDA-approved tobacco cessation medication was offered at discharge and the patient refused  Mental Status Per Nursing Assessment::   On Admission:     Current Mental Status by Physician: NA  Loss Factors: Loss of significant relationship and Financial problems/change in  socioeconomic status  Historical Factors: Impulsivity  Risk Reduction Factors:   Responsible for children under 67 years of age and Sense of responsibility to family  Continued Clinical Symptoms:  Depression:   Comorbid alcohol abuse/dependence Severe Alcohol/Substance Abuse/Dependencies  Cognitive Features That Contribute To Risk:  None    Suicide Risk:  Minimal: No identifiable suicidal ideation.  Patients presenting with no risk factors but with morbid ruminations; may be classified as minimal risk based on the severity of the depressive symptoms  Principal Problem: Major depressive disorder, single episode with anxious distress Discharge Diagnoses:  Patient Active Problem List   Diagnosis Date Noted  . Major depressive disorder, single episode with anxious distress [F32.9] 03/08/2015  . Opioid use disorder, severe, dependence (HCC) [F11.20] 03/07/2015  . Cannabis use disorder, severe, dependence (HCC) [F12.20] 03/07/2015  . Tobacco use disorder [F17.200] 03/07/2015      Plan Of Care/Follow-up recommendations:  Activity:  As tolerated. Diet:  Low sodium heart healthy. Other:  Keep follow-up appointments.  Is patient on multiple antipsychotic therapies at discharge:  No   Has Patient had three or more failed trials of antipsychotic monotherapy by history:  No  Recommended Plan for Multiple Antipsychotic Therapies: NA    Tanor Glaspy 03/14/2015, 10:31 AM

## 2015-03-14 NOTE — B[facility] Group Notes (Signed)
[facility] LCSW Aftercare Discharge Planning Group Note   03/14/2015 11:38 AM  Participation Quality:  Patient attended group and shared that his SMART goal was to "pick up coping skills to help with my anxiety". Patient reports that his anxiety is increasing and that his belongings may be auctioned off today from a storage unit his wife had his things placed into. Patient was called out of group early by staff and did not return.  Mood/Affect:  Anxious    Anxiety Rating:  9  Thoughts of Suicide:  No Will you contract for safety?   NA  Current AVH:  No  Plan for Discharge/Comments:  Patient is currently homeless and is hoping to get into a homeless veterans program in [place] for substance use treatment.  Supports: Patient's father and family is supportive but will not let patient reside with them due to his hx of behavior in their home.   Lulu Riding, MSW, LCSWA

## 2015-03-14 NOTE — Progress Notes (Signed)
  [facility] Adult Case Management Discharge Plan :  Will you be returning to the same living situation after discharge:  No. At discharge, do you have transportation home?: Yes,  bus Do you have the ability to pay for your medications: Yes,  Mental health '  Release of information consent forms completed and in the chart;  Patient's signature needed at discharge.  Patient to Follow up at: Follow-up Information    Follow up with [facility] In 1 day.   Specialty:  Behavioral Health   Why:  Your first appointment will be walk in. Walk in hours: Monday- Friday 8:00am -10:00am. Please take all of your hospital paperwork.    Contact information:   [address] [place] [address] [phone]       Patient denies SI/HI: Yes,  yes    Safety Planning and Suicide Prevention discussed: Yes,  With pt and father   Have you used any form of tobacco in the last 30 days? (Cigarettes, Smokeless Tobacco, Cigars, and/or Pipes): Yes  Has patient been referred to the Quitline?: Patient refused referral  Rondall Allegra MSW, LCSWA  03/14/2015, 2:46 PM

## 2015-03-14 NOTE — [facility] Suicide Risk Assessment (Signed)
[facility] INPATIENT:  Family/Significant Other Suicide Prevention Education  Suicide Prevention Education:  Education Completed; Dave Wolf (father) has been identified by the patient as the family member/significant other with whom the patient will be residing, and identified as the person(s) who will aid the patient in the event of a mental health crisis (suicidal ideations/suicide attempt).  With written consent from the patient, the family member/significant other has been provided the following suicide prevention education, prior to the and/or following the discharge of the patient.  The suicide prevention education provided includes the following:  Suicide risk factors  Suicide prevention and interventions  National Suicide Hotline telephone number  [facility] assessment telephone number  [facility] Emergency Assistance 911  [facility] and/or Residential Mobile Crisis Unit telephone number  Request made of family/significant other to:  Remove weapons (e.g., guns, rifles, knives), all items previously/currently identified as safety concern.    Remove drugs/medications (over-the-counter, prescriptions, illicit drugs), all items previously/currently identified as a safety concern.  The family member/significant other verbalizes understanding of the suicide prevention education information provided.  The family member/significant other agrees to remove the items of safety concern listed above.  Nejla Reasor L Criss Bartles MSW, LCSWA  03/14/2015, 2:48 PM

## 2015-03-14 NOTE — [facility] Group Notes (Signed)
[facility] Group Notes:  (Nursing/MHT/Case Management/Adjunct)  Date:  03/14/2015  Time:  5:16 AM  Type of Therapy:  Group Therapy  Participation Level:  Active  Participation Quality:  Appropriate  Affect:  Appropriate  Cognitive:  Appropriate  Insight:  Appropriate  Engagement in Group:  Engaged  Modes of Intervention:  n/a  Summary of Progress/Problems:  Dave Wolf 03/14/2015, 5:16 AM
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
## 2015-03-14 NOTE — Plan of Care (Signed)
Problem: Ineffective individual coping Goal: STG: Patient will remain free from self harm Outcome: Progressing Medications (Trazadone 100 mg, Cyclobenzaprine 7.5 mg) administered as ordered by the physician, medications Therapeutic Effects, SEs and Adverse effects discussed, questions encouraged; Hydroxyzine 50 mg PO PRN given, 15 minute checks maintained for safety, clinical and moral support provided, patient encouraged to continue to express feelings and demonstrate safe care. Patient remain free from harm, will continue to monitor.

## 2015-03-15 NOTE — Tx Team (Addendum)
Interdisciplinary Treatment Plan Update (Adult)  Date:  03/15/2015 (Late entry for 03/14/2015)  Time Reviewed:  9:27 AM  Progress in Treatment: Attending groups: Yes. Participating in groups:  Yes. Taking medication as prescribed:  Yes. Tolerating medication:  Yes. Family/Significant othe contact made:  Yes, individual(s) contacted:  Glory Buff Sr.  Patient understands diagnosis:  Yes. Discussing patient identified problems/goals with staff:  Yes. Medical problems stabilized or resolved:  Yes. Denies suicidal/homicidal ideation: Yes. Issues/concerns per patient self-inventory:  Yes. Other:  New problem(s) identified: NA  Discharge Plan or Barriers: Pt plan to stay with a friend in [place] and follow up with [facility].   Reason for Continuation of Hospitalization: Depression Medication stabilization Suicidal ideation Withdrawal symptoms  Comments:Dave Wolf has had a history of narcotic abuse for several years now. He believes that he suffered a back injury while in the [employer] and needs narcotics to treat his pain. There is no objective evidence in the chart that this is the case. He reports that his first dose of narcotics were prescribed at the [facility] for bulging disc several years ago. He then went on to abusing painkillers. He reports that he had not been using any 2 months ago where his wife asked him to leave. She reportedly fell out of love with him. She wants him to sign separation papers. She refuses to let him see their 2 children after he signs the patient. The patient was a stay home for the past 5 years and misses his children. He's been homeless stay with friends ever since. He relapsed on narcotics. He reports many symptoms of depression with poor sleep, decreased appetite, anhedonia, feeling of energy and concentration, social isolation, crying spells, heightened anxiety and suicidal thinking. He went to railroad tracks waiting for a train. He went under a bridge when it started  raining and took it as a sign. He denies psychotic symptoms. He denies symptoms suggestive of bipolar mania. He denies symptoms of OCD or PTSD. He does have panic attacks. He does not use alcohol. He is a daily marijuana smoker.  Estimated length of stay: Pt will discharge 03/14/2015  New goal(s): NA  Review of initial/current patient goals per problem list:   1.  Goal(s): Patient will participate in aftercare plan * Met:  * Target date: at discharge * As evidenced by: Patient will participate within aftercare plan AEB aftercare provider and housing plan at discharge being identified.   2.  Goal (s): Patient will exhibit decreased depressive symptoms and suicidal ideations. * Met:  *  Target date: at discharge * As evidenced by: Patient will utilize self rating of depression at 3 or below and demonstrate decreased signs of depression or be deemed stable for discharge by MD.   3.  Goal(s): Patient will demonstrate decreased signs and symptoms of anxiety. * Met:  * Target date: at discharge * As evidenced by: Patient will utilize self rating of anxiety at 3 or below and demonstrated decreased signs of anxiety, or be deemed stable for discharge by MD   4.  Goal(s): Patient will demonstrate decreased signs of withdrawal due to substance abuse * Met:  * Target date: at discharge * As evidenced by: Patient will produce a CIWA/COWS score of 0, have stable vitals signs, and no symptoms of withdrawal.  Attendees: Patient:  Dave Wolf 10/6/20169:27 AM  Family:   10/6/20169:27 AM  Physician:  Dr. Bary Leriche  10/6/20169:27 AM  Nursing:    10/6/20169:27 AM  Case Manager:   10/6/20169:27  AM  Counselor:   10/6/20169:27 AM  Other:  [place] 10/6/20169:27 AM  Other:   10/6/20169:27 AM  Other:   10/6/20169:27 AM  Other:  10/6/20169:27 AM  Other:  10/6/20169:27 AM  Other:  10/6/20169:27 AM  Other:  10/6/20169:27 AM  Other:  10/6/20169:27 AM  Other:  10/6/20169:27 AM  Other:    10/6/20169:27 AM   Scribe for Treatment Team:   Wray Kearns MSW, LCSWA , 03/15/2015, 9:27 AM

## 2017-05-25 IMAGING — CR DG LUMBAR SPINE COMPLETE 4+V
1 series · 5 of 5 positions shown · non-contrast
Comparison: 06/26/2014 MR and 05/18/2013 radiographs

CLINICAL DATA: 29-year-old male with lumbar pain radiating down
left leg for 3 days.

EXAM:
LUMBAR SPINE - COMPLETE 4+ VIEW

[Series 1: t lumbar spine ap · 0.14mm/px · 5 of 5 slices shown]
[im 1/5]
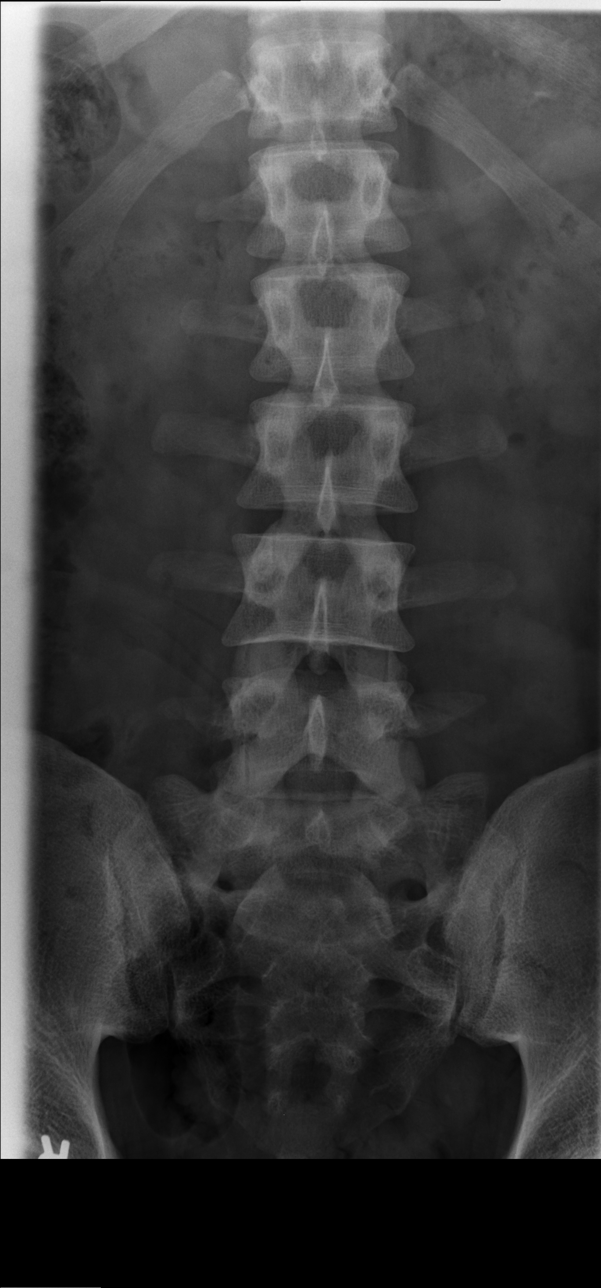
[im 2/5]
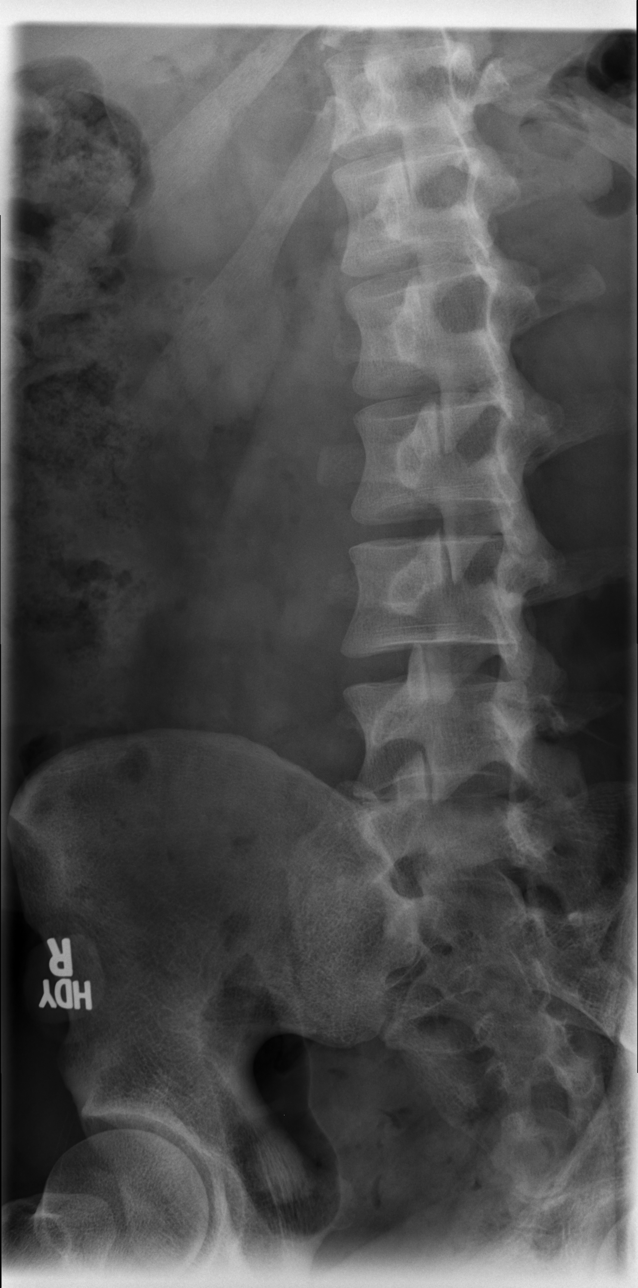
[im 3/5]
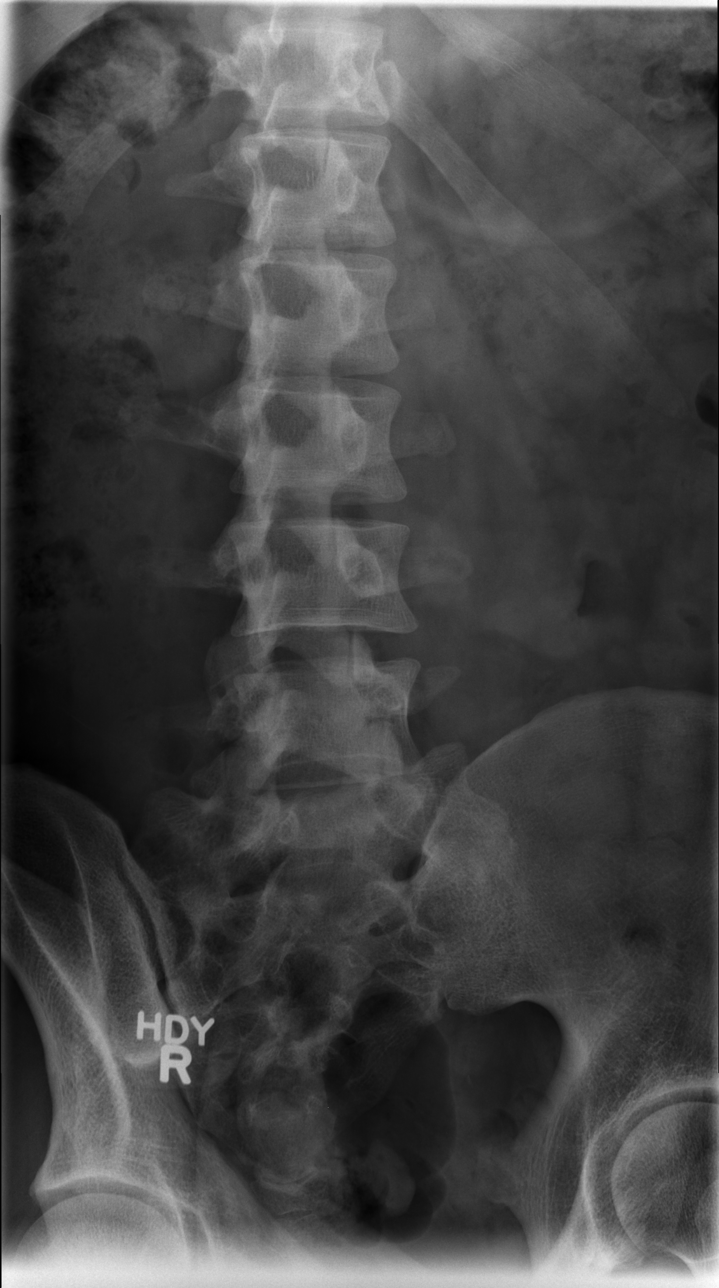
[im 4/5]
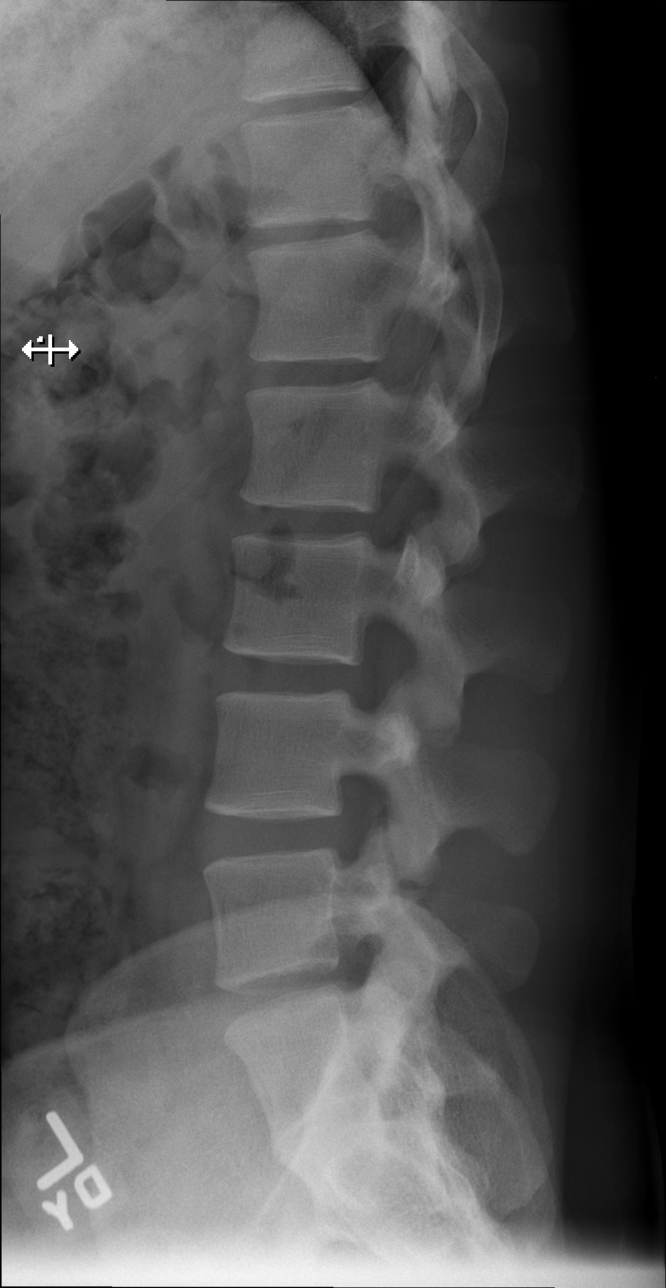
[im 5/5]
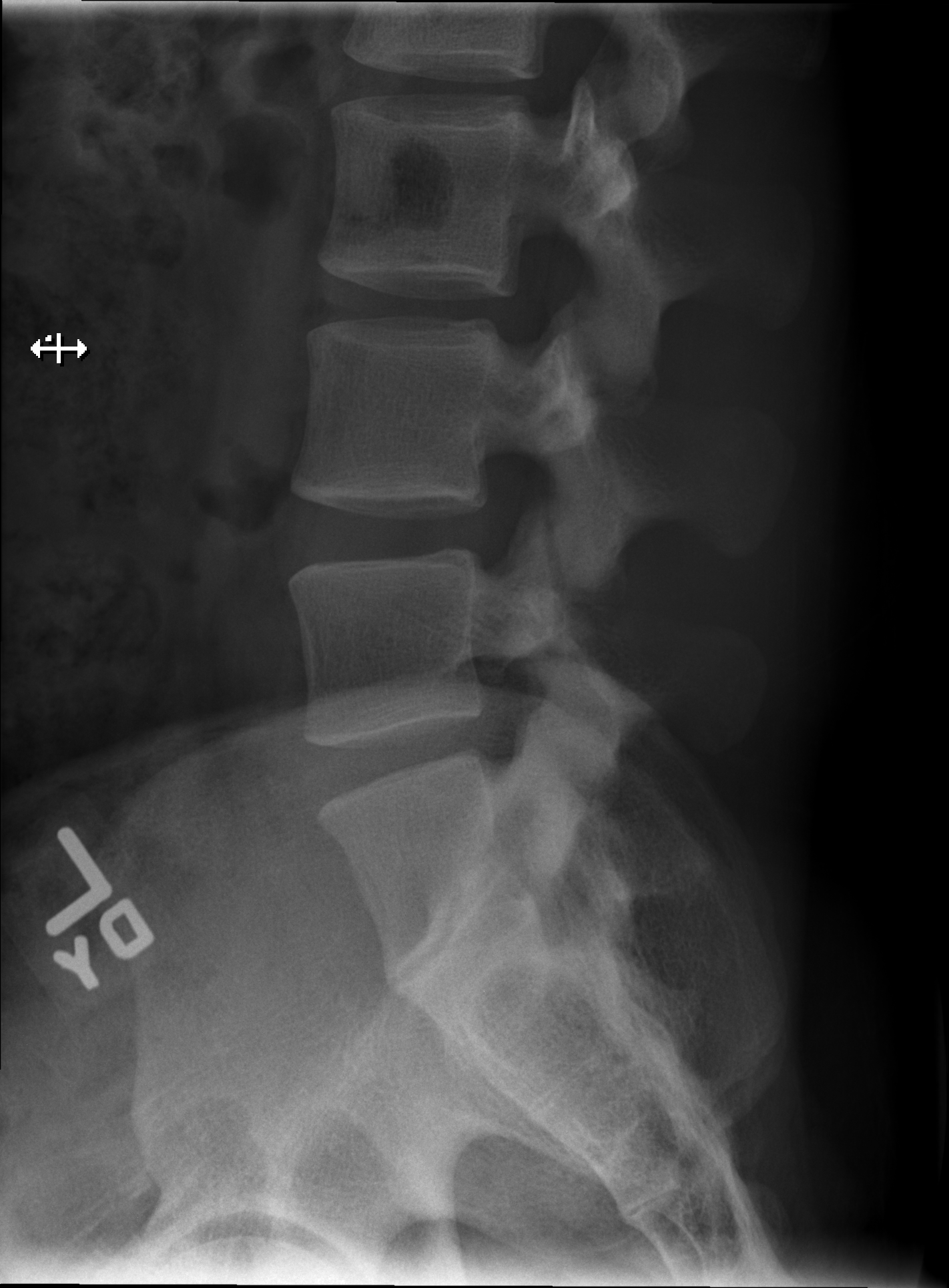

[5 of 5 positions shown; findings below may reference images not displayed]

FINDINGS: There is no evidence of lumbar spine fracture. Alignment is normal.
Intervertebral disc spaces are maintained.
IMPRESSION: Negative.
